# Patient Record
Sex: Female | Born: 1950 | Race: Black or African American | Hispanic: No | Marital: Married | Smoking: Former smoker
Health system: Southern US, Community
[De-identification: ages and names within clinical notes are randomized; demographics above are authoritative.]

## PROBLEM LIST (undated history)

## (undated) DIAGNOSIS — F32A Depression, unspecified: Secondary | ICD-10-CM

## (undated) DIAGNOSIS — W3400XA Accidental discharge from unspecified firearms or gun, initial encounter: Secondary | ICD-10-CM

## (undated) DIAGNOSIS — H409 Unspecified glaucoma: Secondary | ICD-10-CM

## (undated) DIAGNOSIS — N189 Chronic kidney disease, unspecified: Secondary | ICD-10-CM

## (undated) DIAGNOSIS — C50919 Malignant neoplasm of unspecified site of unspecified female breast: Secondary | ICD-10-CM

## (undated) DIAGNOSIS — G4730 Sleep apnea, unspecified: Secondary | ICD-10-CM

## (undated) DIAGNOSIS — F329 Major depressive disorder, single episode, unspecified: Secondary | ICD-10-CM

## (undated) DIAGNOSIS — K259 Gastric ulcer, unspecified as acute or chronic, without hemorrhage or perforation: Secondary | ICD-10-CM

## (undated) DIAGNOSIS — I10 Essential (primary) hypertension: Secondary | ICD-10-CM

## (undated) DIAGNOSIS — K219 Gastro-esophageal reflux disease without esophagitis: Secondary | ICD-10-CM

## (undated) DIAGNOSIS — J449 Chronic obstructive pulmonary disease, unspecified: Secondary | ICD-10-CM

## (undated) DIAGNOSIS — F419 Anxiety disorder, unspecified: Secondary | ICD-10-CM

## (undated) DIAGNOSIS — J45909 Unspecified asthma, uncomplicated: Secondary | ICD-10-CM

## (undated) HISTORY — PX: MASTECTOMY: SHX3

## (undated) HISTORY — PX: CARPAL TUNNEL RELEASE: SHX101

---

## 2007-03-12 ENCOUNTER — Emergency Department (HOSPITAL_COMMUNITY): Admission: EM | Admit: 2007-03-12 | Discharge: 2007-03-12 | Payer: Self-pay | Admitting: Emergency Medicine

## 2015-01-18 DIAGNOSIS — Z760 Encounter for issue of repeat prescription: Secondary | ICD-10-CM | POA: Diagnosis not present

## 2015-01-18 DIAGNOSIS — J440 Chronic obstructive pulmonary disease with acute lower respiratory infection: Secondary | ICD-10-CM | POA: Diagnosis not present

## 2015-01-18 DIAGNOSIS — Z87891 Personal history of nicotine dependence: Secondary | ICD-10-CM | POA: Diagnosis not present

## 2015-01-18 DIAGNOSIS — M5489 Other dorsalgia: Secondary | ICD-10-CM | POA: Diagnosis not present

## 2015-02-27 DIAGNOSIS — Z9841 Cataract extraction status, right eye: Secondary | ICD-10-CM | POA: Diagnosis not present

## 2015-02-27 DIAGNOSIS — Z9842 Cataract extraction status, left eye: Secondary | ICD-10-CM | POA: Diagnosis not present

## 2015-02-27 DIAGNOSIS — H4011X3 Primary open-angle glaucoma, severe stage: Secondary | ICD-10-CM | POA: Diagnosis not present

## 2015-02-27 DIAGNOSIS — H04123 Dry eye syndrome of bilateral lacrimal glands: Secondary | ICD-10-CM | POA: Diagnosis not present

## 2015-03-17 DIAGNOSIS — R079 Chest pain, unspecified: Secondary | ICD-10-CM | POA: Diagnosis not present

## 2015-03-17 DIAGNOSIS — I517 Cardiomegaly: Secondary | ICD-10-CM | POA: Diagnosis not present

## 2015-03-20 DIAGNOSIS — J449 Chronic obstructive pulmonary disease, unspecified: Secondary | ICD-10-CM | POA: Diagnosis not present

## 2015-03-20 DIAGNOSIS — M5489 Other dorsalgia: Secondary | ICD-10-CM | POA: Diagnosis not present

## 2015-03-20 DIAGNOSIS — M797 Fibromyalgia: Secondary | ICD-10-CM | POA: Diagnosis not present

## 2015-03-21 DIAGNOSIS — B1920 Unspecified viral hepatitis C without hepatic coma: Secondary | ICD-10-CM | POA: Diagnosis not present

## 2015-03-21 DIAGNOSIS — K759 Inflammatory liver disease, unspecified: Secondary | ICD-10-CM | POA: Diagnosis not present

## 2015-03-21 DIAGNOSIS — Z8619 Personal history of other infectious and parasitic diseases: Secondary | ICD-10-CM | POA: Diagnosis not present

## 2015-03-21 DIAGNOSIS — B182 Chronic viral hepatitis C: Secondary | ICD-10-CM | POA: Diagnosis not present

## 2015-03-21 DIAGNOSIS — J984 Other disorders of lung: Secondary | ICD-10-CM | POA: Diagnosis not present

## 2015-04-20 ENCOUNTER — Encounter (HOSPITAL_COMMUNITY): Payer: Self-pay | Admitting: *Deleted

## 2015-04-20 ENCOUNTER — Emergency Department (HOSPITAL_COMMUNITY)
Admission: EM | Admit: 2015-04-20 | Discharge: 2015-04-20 | Disposition: A | Discharge status: Home / Self Care | Payer: Medicare Other | Attending: Emergency Medicine | Admitting: Emergency Medicine

## 2015-04-20 ENCOUNTER — Emergency Department (HOSPITAL_COMMUNITY): Payer: Medicare Other

## 2015-04-20 DIAGNOSIS — I10 Essential (primary) hypertension: Secondary | ICD-10-CM | POA: Insufficient documentation

## 2015-04-20 DIAGNOSIS — R05 Cough: Secondary | ICD-10-CM

## 2015-04-20 DIAGNOSIS — J069 Acute upper respiratory infection, unspecified: Secondary | ICD-10-CM | POA: Diagnosis not present

## 2015-04-20 DIAGNOSIS — Z853 Personal history of malignant neoplasm of breast: Secondary | ICD-10-CM | POA: Diagnosis not present

## 2015-04-20 DIAGNOSIS — Z87828 Personal history of other (healed) physical injury and trauma: Secondary | ICD-10-CM | POA: Diagnosis not present

## 2015-04-20 DIAGNOSIS — R059 Cough, unspecified: Secondary | ICD-10-CM

## 2015-04-20 HISTORY — DX: Essential (primary) hypertension: I10

## 2015-04-20 HISTORY — DX: Malignant neoplasm of unspecified site of unspecified female breast: C50.919

## 2015-04-20 HISTORY — DX: Accidental discharge from unspecified firearms or gun, initial encounter: W34.00XA

## 2015-04-20 MED ORDER — GUAIFENESIN-CODEINE 100-10 MG/5ML PO SOLN: 5.0000 mL | Freq: Four times a day (QID) | ORAL | Status: DC | PRN

## 2015-04-20 MED ORDER — PREDNISONE 20 MG PO TABS: 40.0000 mg | ORAL_TABLET | Freq: Every day | ORAL | Status: DC

## 2015-04-20 MED ORDER — GUAIFENESIN-CODEINE 100-10 MG/5ML PO SOLN
5.0000 mL | Freq: Once | ORAL | Status: AC
  Administered 2015-04-20: 5 mL via ORAL
  Filled 2015-04-20: qty 5

## 2015-04-20 NOTE — ED Notes (Signed)
Pt c/o generalized body aches, cough, sore throat for 4-5 days.

## 2015-04-20 NOTE — Discharge Instructions (Signed)
Upper Respiratory Infection, Adult An upper respiratory infection (URI) is also sometimes known as the common cold. The upper respiratory tract includes the nose, sinuses, throat, trachea, and bronchi. Bronchi are the airways leading to the lungs. Most people improve within 1 week, but symptoms can last up to 2 weeks. A residual cough may last even longer.  CAUSES Many different viruses can infect the tissues lining the upper respiratory tract. The tissues become irritated and inflamed and often become very moist. Mucus production is also common. A cold is contagious. You can easily spread the virus to others by oral contact. This includes kissing, sharing a glass, coughing, or sneezing. Touching your mouth or nose and then touching a surface, which is then touched by another person, can also spread the virus. SYMPTOMS  Symptoms typically develop 1 to 3 days after you come in contact with a cold virus. Symptoms vary from person to person. They may include:  Runny nose.  Sneezing.  Nasal congestion.  Sinus irritation.  Sore throat.  Loss of voice (laryngitis).  Cough.  Fatigue.  Muscle aches.  Loss of appetite.  Headache.  Low-grade fever. DIAGNOSIS  You might diagnose your own cold based on familiar symptoms, since most people get a cold 2 to 3 times a year. Your caregiver can confirm this based on your exam. Most importantly, your caregiver can check that your symptoms are not due to another disease such as strep throat, sinusitis, pneumonia, asthma, or epiglottitis. Blood tests, throat tests, and X-rays are not necessary to diagnose a common cold, but they may sometimes be helpful in excluding other more serious diseases. Your caregiver will decide if any further tests are required. RISKS AND COMPLICATIONS  You may be at risk for a more severe case of the common cold if you smoke cigarettes, have chronic heart disease (such as heart failure) or lung disease (such as asthma), or if  you have a weakened immune system. The very young and very old are also at risk for more serious infections. Bacterial sinusitis, middle ear infections, and bacterial pneumonia can complicate the common cold. The common cold can worsen asthma and chronic obstructive pulmonary disease (COPD). Sometimes, these complications can require emergency medical care and may be life-threatening. PREVENTION  The best way to protect against getting a cold is to practice good hygiene. Avoid oral or hand contact with people with cold symptoms. Wash your hands often if contact occurs. There is no clear evidence that vitamin C, vitamin E, echinacea, or exercise reduces the chance of developing a cold. However, it is always recommended to get plenty of rest and practice good nutrition. TREATMENT  Treatment is directed at relieving symptoms. There is no cure. Antibiotics are not effective, because the infection is caused by a virus, not by bacteria. Treatment may include:  Increased fluid intake. Sports drinks offer valuable electrolytes, sugars, and fluids.  Breathing heated mist or steam (vaporizer or shower).  Eating chicken soup or other clear broths, and maintaining good nutrition.  Getting plenty of rest.  Using gargles or lozenges for comfort.  Controlling fevers with ibuprofen or acetaminophen as directed by your caregiver.  Increasing usage of your inhaler if you have asthma. Zinc gel and zinc lozenges, taken in the first 24 hours of the common cold, can shorten the duration and lessen the severity of symptoms. Pain medicines may help with fever, muscle aches, and throat pain. A variety of non-prescription medicines are available to treat congestion and runny nose. Your caregiver   can make recommendations and may suggest nasal or lung inhalers for other symptoms.  HOME CARE INSTRUCTIONS   Only take over-the-counter or prescription medicines for pain, discomfort, or fever as directed by your  caregiver.  Use a warm mist humidifier or inhale steam from a shower to increase air moisture. This may keep secretions moist and make it easier to breathe.  Drink enough water and fluids to keep your urine clear or pale yellow.  Rest as needed.  Return to work when your temperature has returned to normal or as your caregiver advises. You may need to stay home longer to avoid infecting others. You can also use a face mask and careful hand washing to prevent spread of the virus. SEEK MEDICAL CARE IF:   After the first few days, you feel you are getting worse rather than better.  You need your caregiver's advice about medicines to control symptoms.  You develop chills, worsening shortness of breath, or brown or red sputum. These may be signs of pneumonia.  You develop yellow or brown nasal discharge or pain in the face, especially when you bend forward. These may be signs of sinusitis.  You develop a fever, swollen neck glands, pain with swallowing, or white areas in the back of your throat. These may be signs of strep throat. SEEK IMMEDIATE MEDICAL CARE IF:   You have a fever.  You develop severe or persistent headache, ear pain, sinus pain, or chest pain.  You develop wheezing, a prolonged cough, cough up blood, or have a change in your usual mucus (if you have chronic lung disease).  You develop sore muscles or a stiff neck. Document Released: 06/11/2001 Document Revised: 03/09/2012 Document Reviewed: 03/23/2014 Exit[employer] Patient Information 2015 [employer]. This information is not intended to replace advice given to you by your health care provider. Make sure you discuss any questions you have with your health care provider.  
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
## 2015-04-27 DIAGNOSIS — M4696 Unspecified inflammatory spondylopathy, lumbar region: Secondary | ICD-10-CM | POA: Diagnosis not present

## 2015-04-27 DIAGNOSIS — J4521 Mild intermittent asthma with (acute) exacerbation: Secondary | ICD-10-CM | POA: Diagnosis not present

## 2015-04-27 DIAGNOSIS — M5489 Other dorsalgia: Secondary | ICD-10-CM | POA: Diagnosis not present

## 2015-04-27 DIAGNOSIS — Z87891 Personal history of nicotine dependence: Secondary | ICD-10-CM | POA: Diagnosis not present

## 2015-05-25 DIAGNOSIS — J449 Chronic obstructive pulmonary disease, unspecified: Secondary | ICD-10-CM | POA: Diagnosis not present

## 2015-05-25 DIAGNOSIS — M545 Low back pain: Secondary | ICD-10-CM | POA: Diagnosis not present

## 2015-05-25 DIAGNOSIS — Z760 Encounter for issue of repeat prescription: Secondary | ICD-10-CM | POA: Diagnosis not present

## 2015-06-28 DIAGNOSIS — Z760 Encounter for issue of repeat prescription: Secondary | ICD-10-CM | POA: Diagnosis not present

## 2015-06-28 DIAGNOSIS — M5489 Other dorsalgia: Secondary | ICD-10-CM | POA: Diagnosis not present

## 2015-08-07 DIAGNOSIS — M545 Low back pain: Secondary | ICD-10-CM | POA: Diagnosis not present

## 2015-08-07 DIAGNOSIS — R531 Weakness: Secondary | ICD-10-CM | POA: Diagnosis not present

## 2015-08-07 DIAGNOSIS — Z760 Encounter for issue of repeat prescription: Secondary | ICD-10-CM | POA: Diagnosis not present

## 2015-08-07 DIAGNOSIS — M5489 Other dorsalgia: Secondary | ICD-10-CM | POA: Diagnosis not present

## 2015-09-07 DIAGNOSIS — Z9011 Acquired absence of right breast and nipple: Secondary | ICD-10-CM | POA: Diagnosis not present

## 2015-09-07 DIAGNOSIS — J3081 Allergic rhinitis due to animal (cat) (dog) hair and dander: Secondary | ICD-10-CM | POA: Diagnosis not present

## 2015-09-07 DIAGNOSIS — J410 Simple chronic bronchitis: Secondary | ICD-10-CM | POA: Diagnosis not present

## 2015-09-07 DIAGNOSIS — M545 Low back pain: Secondary | ICD-10-CM | POA: Diagnosis not present

## 2015-10-10 DIAGNOSIS — G8929 Other chronic pain: Secondary | ICD-10-CM | POA: Insufficient documentation

## 2015-10-10 DIAGNOSIS — G4733 Obstructive sleep apnea (adult) (pediatric): Secondary | ICD-10-CM | POA: Insufficient documentation

## 2016-05-10 DIAGNOSIS — B078 Other viral warts: Secondary | ICD-10-CM | POA: Diagnosis not present

## 2016-05-22 DIAGNOSIS — R1110 Vomiting, unspecified: Secondary | ICD-10-CM | POA: Diagnosis not present

## 2016-05-22 DIAGNOSIS — G4730 Sleep apnea, unspecified: Secondary | ICD-10-CM | POA: Diagnosis not present

## 2016-05-22 DIAGNOSIS — R109 Unspecified abdominal pain: Secondary | ICD-10-CM | POA: Diagnosis not present

## 2016-05-22 DIAGNOSIS — I10 Essential (primary) hypertension: Secondary | ICD-10-CM | POA: Diagnosis not present

## 2016-06-04 DIAGNOSIS — K210 Gastro-esophageal reflux disease with esophagitis: Secondary | ICD-10-CM | POA: Diagnosis not present

## 2016-06-04 DIAGNOSIS — K253 Acute gastric ulcer without hemorrhage or perforation: Secondary | ICD-10-CM | POA: Diagnosis not present

## 2016-06-04 DIAGNOSIS — K2980 Duodenitis without bleeding: Secondary | ICD-10-CM | POA: Diagnosis not present

## 2016-06-04 DIAGNOSIS — K2900 Acute gastritis without bleeding: Secondary | ICD-10-CM | POA: Diagnosis not present

## 2016-06-05 DIAGNOSIS — B078 Other viral warts: Secondary | ICD-10-CM | POA: Diagnosis not present

## 2016-09-13 ENCOUNTER — Encounter (HOSPITAL_COMMUNITY): Payer: Self-pay

## 2016-09-13 ENCOUNTER — Emergency Department (HOSPITAL_COMMUNITY): Payer: Medicare Other

## 2016-09-13 ENCOUNTER — Observation Stay (HOSPITAL_COMMUNITY)
Admission: EM | Admit: 2016-09-13 | Discharge: 2016-09-14 | Disposition: A | Discharge status: Home / Self Care | Payer: Medicare Other | Attending: Nephrology | Admitting: Nephrology

## 2016-09-13 DIAGNOSIS — J441 Chronic obstructive pulmonary disease with (acute) exacerbation: Secondary | ICD-10-CM | POA: Insufficient documentation

## 2016-09-13 DIAGNOSIS — R110 Nausea: Secondary | ICD-10-CM

## 2016-09-13 DIAGNOSIS — Z66 Do not resuscitate: Secondary | ICD-10-CM | POA: Diagnosis not present

## 2016-09-13 DIAGNOSIS — J9601 Acute respiratory failure with hypoxia: Secondary | ICD-10-CM

## 2016-09-13 DIAGNOSIS — G894 Chronic pain syndrome: Secondary | ICD-10-CM

## 2016-09-13 DIAGNOSIS — J40 Bronchitis, not specified as acute or chronic: Secondary | ICD-10-CM

## 2016-09-13 DIAGNOSIS — I10 Essential (primary) hypertension: Secondary | ICD-10-CM | POA: Diagnosis not present

## 2016-09-13 DIAGNOSIS — E785 Hyperlipidemia, unspecified: Secondary | ICD-10-CM | POA: Diagnosis present

## 2016-09-13 DIAGNOSIS — J431 Panlobular emphysema: Secondary | ICD-10-CM

## 2016-09-13 DIAGNOSIS — Z79899 Other long term (current) drug therapy: Secondary | ICD-10-CM | POA: Insufficient documentation

## 2016-09-13 DIAGNOSIS — Z9011 Acquired absence of right breast and nipple: Secondary | ICD-10-CM | POA: Insufficient documentation

## 2016-09-13 DIAGNOSIS — J9600 Acute respiratory failure, unspecified whether with hypoxia or hypercapnia: Principal | ICD-10-CM | POA: Diagnosis present

## 2016-09-13 DIAGNOSIS — J45901 Unspecified asthma with (acute) exacerbation: Secondary | ICD-10-CM | POA: Diagnosis present

## 2016-09-13 DIAGNOSIS — J069 Acute upper respiratory infection, unspecified: Secondary | ICD-10-CM | POA: Diagnosis not present

## 2016-09-13 DIAGNOSIS — Z853 Personal history of malignant neoplasm of breast: Secondary | ICD-10-CM | POA: Diagnosis not present

## 2016-09-13 DIAGNOSIS — R0602 Shortness of breath: Secondary | ICD-10-CM

## 2016-09-13 DIAGNOSIS — B9789 Other viral agents as the cause of diseases classified elsewhere: Secondary | ICD-10-CM

## 2016-09-13 DIAGNOSIS — J449 Chronic obstructive pulmonary disease, unspecified: Secondary | ICD-10-CM | POA: Diagnosis present

## 2016-09-13 DIAGNOSIS — R062 Wheezing: Secondary | ICD-10-CM

## 2016-09-13 HISTORY — DX: Unspecified asthma, uncomplicated: J45.909

## 2016-09-13 HISTORY — DX: Chronic obstructive pulmonary disease, unspecified: J44.9

## 2016-09-13 LAB — BASIC METABOLIC PANEL
Anion gap: 10 (ref 5–15)
BUN: 11 mg/dL (ref 6–20)
CALCIUM: 9.8 mg/dL (ref 8.9–10.3)
CHLORIDE: 110 mmol/L (ref 101–111)
CO2: 23 mmol/L (ref 22–32)
CREATININE: 0.76 mg/dL (ref 0.44–1.00)
GFR calc Af Amer: >60 mL/min (ref >60)
GFR calc non Af Amer: >60 mL/min (ref >60)
GLUCOSE: 81 mg/dL (ref 65–99)
Potassium: 3.5 mmol/L (ref 3.5–5.1)
Sodium: 143 mmol/L (ref 135–145)

## 2016-09-13 LAB — CBC WITH DIFFERENTIAL/PLATELET
BASOS PCT: 1 %
Basophils Absolute: 0 10*3/uL (ref 0.0–0.1)
Eosinophils Absolute: 0.2 10*3/uL (ref 0.0–0.7)
Eosinophils Relative: 3 %
HEMATOCRIT: 42.9 % (ref 36.0–46.0)
HEMOGLOBIN: 13.6 g/dL (ref 12.0–15.0)
LYMPHS ABS: 1.7 10*3/uL (ref 0.7–4.0)
LYMPHS PCT: 22 %
MCH: 26.6 pg (ref 26.0–34.0)
MCHC: 31.7 g/dL (ref 30.0–36.0)
MCV: 84 fL (ref 78.0–100.0)
MONO ABS: 0.8 10*3/uL (ref 0.1–1.0)
MONOS PCT: 10 %
NEUTROS ABS: 4.8 10*3/uL (ref 1.7–7.7)
Neutrophils Relative %: 64 %
Platelets: 247 10*3/uL (ref 150–400)
RBC: 5.11 MIL/uL (ref 3.87–5.11)
RDW: 15.1 % (ref 11.5–15.5)
WBC: 7.5 10*3/uL (ref 4.0–10.5)

## 2016-09-13 LAB — I-STAT TROPONIN, ED: Troponin i, poc: 0 ng/mL (ref 0.00–0.08)

## 2016-09-13 MED ORDER — ATORVASTATIN CALCIUM 20 MG PO TABS: 20.0000 mg | ORAL_TABLET | Freq: Every day | ORAL | Status: DC

## 2016-09-13 MED ORDER — PREDNISONE 50 MG PO TABS: 50.0000 mg | ORAL_TABLET | Freq: Every day | ORAL | Status: DC

## 2016-09-13 MED ORDER — FAMOTIDINE 20 MG PO TABS
20.0000 mg | ORAL_TABLET | Freq: Every day | ORAL | Status: DC
  Administered 2016-09-13 – 2016-09-14 (×2): 20 mg via ORAL
  Filled 2016-09-13 (×2): qty 1

## 2016-09-13 MED ORDER — IPRATROPIUM BROMIDE 0.02 % IN SOLN
0.5000 mg | Freq: Once | RESPIRATORY_TRACT | Status: AC
  Administered 2016-09-13: 0.5 mg via RESPIRATORY_TRACT
  Filled 2016-09-13: qty 2.5

## 2016-09-13 MED ORDER — ALBUTEROL SULFATE (2.5 MG/3ML) 0.083% IN NEBU
5.0000 mg | INHALATION_SOLUTION | Freq: Once | RESPIRATORY_TRACT | Status: AC
  Administered 2016-09-13: 5 mg via RESPIRATORY_TRACT
  Filled 2016-09-13: qty 6

## 2016-09-13 MED ORDER — ONDANSETRON HCL 4 MG/2ML IJ SOLN
4.0000 mg | Freq: Once | INTRAMUSCULAR | Status: AC
  Administered 2016-09-13: 4 mg via INTRAVENOUS
  Filled 2016-09-13: qty 2

## 2016-09-13 MED ORDER — BRINZOLAMIDE 1 % OP SUSP
1.0000 [drp] | Freq: Three times a day (TID) | OPHTHALMIC | Status: DC
  Administered 2016-09-13 – 2016-09-14 (×3): 1 [drp] via OPHTHALMIC
  Filled 2016-09-13: qty 10

## 2016-09-13 MED ORDER — HYDROCODONE-HOMATROPINE 5-1.5 MG/5ML PO SYRP
5.0000 mL | ORAL_SOLUTION | ORAL | Status: DC | PRN
  Administered 2016-09-13: 5 mL via ORAL
  Filled 2016-09-13: qty 5

## 2016-09-13 MED ORDER — ONDANSETRON HCL 4 MG/2ML IJ SOLN
4.0000 mg | Freq: Four times a day (QID) | INTRAMUSCULAR | Status: DC | PRN
  Administered 2016-09-14: 4 mg via INTRAVENOUS

## 2016-09-13 MED ORDER — SODIUM CHLORIDE 0.9 % IV SOLN
INTRAVENOUS | Status: DC
  Administered 2016-09-13 – 2016-09-14 (×2): via INTRAVENOUS

## 2016-09-13 MED ORDER — ACETAMINOPHEN 325 MG PO TABS: 650.0000 mg | ORAL_TABLET | Freq: Four times a day (QID) | ORAL | Status: DC | PRN

## 2016-09-13 MED ORDER — PREDNISONE 50 MG PO TABS
50.0000 mg | ORAL_TABLET | Freq: Every day | ORAL | Status: DC
  Administered 2016-09-14: 50 mg via ORAL
  Filled 2016-09-13: qty 1

## 2016-09-13 MED ORDER — OXYCODONE-ACETAMINOPHEN 5-325 MG PO TABS
2.0000 | ORAL_TABLET | ORAL | Status: DC | PRN
  Administered 2016-09-13 – 2016-09-14 (×4): 2 via ORAL
  Filled 2016-09-13 (×4): qty 2

## 2016-09-13 MED ORDER — MAGNESIUM SULFATE 2 GM/50ML IV SOLN
2.0000 g | Freq: Once | INTRAVENOUS | Status: AC
  Administered 2016-09-13: 2 g via INTRAVENOUS
  Filled 2016-09-13: qty 50

## 2016-09-13 MED ORDER — ENOXAPARIN SODIUM 40 MG/0.4ML ~~LOC~~ SOLN
40.0000 mg | SUBCUTANEOUS | Status: DC
  Administered 2016-09-13: 40 mg via SUBCUTANEOUS
  Filled 2016-09-13: qty 0.4

## 2016-09-13 MED ORDER — ATORVASTATIN CALCIUM 20 MG PO TABS
20.0000 mg | ORAL_TABLET | Freq: Every day | ORAL | Status: DC
  Administered 2016-09-13: 20 mg via ORAL
  Filled 2016-09-13: qty 1

## 2016-09-13 MED ORDER — OXYCODONE-ACETAMINOPHEN 10-325 MG PO TABS: 2.0000 | ORAL_TABLET | ORAL | Status: DC | PRN

## 2016-09-13 MED ORDER — BENZONATATE 100 MG PO CAPS
200.0000 mg | ORAL_CAPSULE | Freq: Three times a day (TID) | ORAL | Status: DC
  Administered 2016-09-13 – 2016-09-14 (×3): 200 mg via ORAL
  Filled 2016-09-13 (×3): qty 2

## 2016-09-13 MED ORDER — IPRATROPIUM-ALBUTEROL 0.5-2.5 (3) MG/3ML IN SOLN
3.0000 mL | RESPIRATORY_TRACT | Status: DC | PRN
  Administered 2016-09-14: 3 mL via RESPIRATORY_TRACT
  Filled 2016-09-13: qty 3

## 2016-09-13 MED ORDER — AMLODIPINE BESYLATE 10 MG PO TABS
10.0000 mg | ORAL_TABLET | Freq: Every day | ORAL | Status: DC
  Administered 2016-09-13 – 2016-09-14 (×2): 10 mg via ORAL
  Filled 2016-09-13 (×2): qty 1

## 2016-09-13 MED ORDER — TIOTROPIUM BROMIDE MONOHYDRATE 18 MCG IN CAPS
18.0000 ug | ORAL_CAPSULE | Freq: Every day | RESPIRATORY_TRACT | Status: DC
  Filled 2016-09-13: qty 5

## 2016-09-13 MED ORDER — ACETAMINOPHEN 650 MG RE SUPP: 650.0000 mg | Freq: Four times a day (QID) | RECTAL | Status: DC | PRN

## 2016-09-13 MED ORDER — ALBUTEROL (5 MG/ML) CONTINUOUS INHALATION SOLN
10.0000 mg/h | INHALATION_SOLUTION | RESPIRATORY_TRACT | Status: DC
  Administered 2016-09-13: 10 mg/h via RESPIRATORY_TRACT
  Filled 2016-09-13: qty 20

## 2016-09-13 MED ORDER — ONDANSETRON HCL 4 MG PO TABS
4.0000 mg | ORAL_TABLET | Freq: Four times a day (QID) | ORAL | Status: DC | PRN
Start: 2016-09-13 — End: 2016-09-14

## 2016-09-13 MED ORDER — ENSURE ENLIVE PO LIQD
237.0000 mL | Freq: Two times a day (BID) | ORAL | Status: DC
  Administered 2016-09-14 (×2): 237 mL via ORAL

## 2016-09-13 MED ORDER — PREDNISONE 20 MG PO TABS
60.0000 mg | ORAL_TABLET | Freq: Once | ORAL | Status: AC
  Administered 2016-09-13: 60 mg via ORAL
  Filled 2016-09-13: qty 3

## 2016-09-13 MED ORDER — PREGABALIN 25 MG PO CAPS
25.0000 mg | ORAL_CAPSULE | Freq: Three times a day (TID) | ORAL | Status: DC
  Administered 2016-09-13 – 2016-09-14 (×2): 25 mg via ORAL
  Filled 2016-09-13 (×2): qty 1

## 2016-09-13 MED ORDER — BRIMONIDINE TARTRATE 0.15 % OP SOLN
1.0000 [drp] | Freq: Three times a day (TID) | OPHTHALMIC | Status: DC
  Administered 2016-09-13 – 2016-09-14 (×4): 1 [drp] via OPHTHALMIC
  Filled 2016-09-13: qty 5

## 2016-09-13 MED ORDER — LATANOPROST 0.005 % OP SOLN
1.0000 [drp] | Freq: Every day | OPHTHALMIC | Status: DC
  Administered 2016-09-13: 1 [drp] via OPHTHALMIC
  Filled 2016-09-13: qty 2.5

## 2016-09-13 MED ORDER — KETOROLAC TROMETHAMINE 30 MG/ML IJ SOLN
15.0000 mg | Freq: Once | INTRAMUSCULAR | Status: AC
  Administered 2016-09-13: 10:00:00 via INTRAVENOUS
  Filled 2016-09-13: qty 1

## 2016-09-13 MED ORDER — OXYCODONE HCL 5 MG PO TABS
10.0000 mg | ORAL_TABLET | ORAL | Status: DC | PRN
  Administered 2016-09-13 – 2016-09-14 (×4): 10 mg via ORAL
  Filled 2016-09-13 (×4): qty 2

## 2016-09-13 NOTE — ED Notes (Signed)
Pt ambulated in hallway with zero assistance. Pts O2 was 97% the whole time. Pt had no complaints at this time. Pt placed back on monitor.
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
## 2016-09-13 NOTE — ED Triage Notes (Signed)
Per Pt, Pt is coming from home with complaints of a productive cough that started a couple days ago. Pt reports yellow sputum and white drainage from her nose. Pt reports having wheezing and Hx of Asthma.

## 2016-09-14 DIAGNOSIS — J45901 Unspecified asthma with (acute) exacerbation: Secondary | ICD-10-CM

## 2016-09-14 DIAGNOSIS — J9600 Acute respiratory failure, unspecified whether with hypoxia or hypercapnia: Secondary | ICD-10-CM | POA: Diagnosis not present

## 2016-09-14 LAB — BASIC METABOLIC PANEL
ANION GAP: 6 (ref 5–15)
BUN: 16 mg/dL (ref 6–20)
CALCIUM: 8.9 mg/dL (ref 8.9–10.3)
CO2: 24 mmol/L (ref 22–32)
Chloride: 108 mmol/L (ref 101–111)
Creatinine, Ser: 0.95 mg/dL (ref 0.44–1.00)
GLUCOSE: 133 mg/dL — AB (ref 65–99)
POTASSIUM: 3.9 mmol/L (ref 3.5–5.1)
SODIUM: 138 mmol/L (ref 135–145)

## 2016-09-14 LAB — CBC
HCT: 35.9 % — ABNORMAL LOW (ref 36.0–46.0)
Hemoglobin: 11.1 g/dL — ABNORMAL LOW (ref 12.0–15.0)
MCH: 26 pg (ref 26.0–34.0)
MCHC: 30.9 g/dL (ref 30.0–36.0)
MCV: 84.1 fL (ref 78.0–100.0)
PLATELETS: 220 10*3/uL (ref 150–400)
RBC: 4.27 MIL/uL (ref 3.87–5.11)
RDW: 15.2 % (ref 11.5–15.5)
WBC: 8.3 10*3/uL (ref 4.0–10.5)

## 2016-09-14 LAB — RESPIRATORY PANEL BY PCR
ADENOVIRUS-RVPPCR: NOT DETECTED
Bordetella pertussis: NOT DETECTED
CHLAMYDOPHILA PNEUMONIAE-RVPPCR: NOT DETECTED
CORONAVIRUS HKU1-RVPPCR: NOT DETECTED
CORONAVIRUS NL63-RVPPCR: NOT DETECTED
CORONAVIRUS OC43-RVPPCR: NOT DETECTED
Coronavirus 229E: NOT DETECTED
Influenza A: NOT DETECTED
Influenza B: NOT DETECTED
Metapneumovirus: NOT DETECTED
Mycoplasma pneumoniae: NOT DETECTED
PARAINFLUENZA VIRUS 1-RVPPCR: NOT DETECTED
PARAINFLUENZA VIRUS 3-RVPPCR: NOT DETECTED
Parainfluenza Virus 2: NOT DETECTED
Parainfluenza Virus 4: NOT DETECTED
RHINOVIRUS / ENTEROVIRUS - RVPPCR: DETECTED — AB
Respiratory Syncytial Virus: NOT DETECTED

## 2016-09-14 MED ORDER — PREDNISONE 50 MG PO TABS: 50.0000 mg | ORAL_TABLET | Freq: Every day | ORAL | 0 refills | Status: DC

## 2016-09-14 MED ORDER — BENZONATATE 200 MG PO CAPS: 200.0000 mg | ORAL_CAPSULE | Freq: Three times a day (TID) | ORAL | 0 refills | Status: DC | PRN

## 2016-09-14 NOTE — Progress Notes (Signed)
Patient discharge instructions were reviewed with patient. Patient verbalized understanding.

## 2016-09-14 NOTE — Progress Notes (Signed)
Nutrition Brief Note  Patient identified on the Malnutrition Screening Tool (MST) Report  Wt Readings from Last 15 Encounters:  09/14/16 164 lb 8 oz (74.6 kg)  04/20/15 193 lb 3 oz (87.6 kg)   Patient has discharge orders. Note for documentation of time.  Pt had reported lost weight. She said she still eats fairly well. She says the reason she thinks has lost weight is due to her gastric ulcers. She sees a Tax adviser monthly for this. She gives UBW of 180-188 lbs.   She notes she is on repletion dose of Vitamin D.  She also reports significant depression/emotional distress. Her son died 3 years ago. She would visit the [place]emetary each day, all day for an unknown length of time. Apparently she moved down to [place] to escape living across from the [place] where he was buried. She says only goes back 1x a month now. She apparently has been told to seek counseling. This sounds like it could be the major cause of her wt loss.   She has d/c orders in and is leaving today. No nutrition interventions warranted at this time. If nutrition issues arise, please consult RD.   Burtis Junes RD, LDN, CNSC Clinical Nutrition Pager: [phone] 09/14/2016 1:21 PM

## 2016-09-14 NOTE — Discharge Summary (Signed)
Physician Discharge Summary  Leslie Allen [id number] DOB: 1951-10-15 DOA: 09/13/2016  PCP: No PCP Per Patient  Admit date: 09/13/2016 Discharge date: 09/14/2016  Admitted From:home Disposition: home  Recommendations for Outpatient Follow-up:  1. Follow up with PCP in 1-2 weeks  Home Health:no Equipment/Devices:no  Discharge Condition:stable CODE STATUS:full Diet recommendation: heart healthy   Brief/Interim Summary:  65 year old female with history of breast cancer status post right mastectomy, COPD/asthma, dyslipidemia, hypertension presented with cough and mild shortness of breath. Patient reported that she was traveling in a bus from [place] when she contacted with the person with upper respiratory tract infection. Patient was admitted for acute respiratory failure secondary due to asthma exacerbation, COPD with possible viral infection. Patient reported dry cough. Treated with steroids with significant improvement in symptoms. Today patient reported feeling much better and she was able to ambulate without difficulty. Her oxygen saturation was acceptable in room air. I advised patient to continue her bronchodilators and medication at home. I also recommended her to follow-up with her PCP in next 1-2 weeks. Patient verbalized understanding. Patient is being discharged home in stable condition.  Discharge Diagnoses:  Principal Problem:   Asthma exacerbation Active Problems:   Viral URI with cough   COPD (chronic obstructive pulmonary disease) ([facility])     HLD (hyperlipidemia)      Discharge Instructions  Discharge Instructions    Call MD for:  difficulty breathing, headache or visual disturbances    Complete by:  As directed    Call MD for:  temperature >100.4    Complete by:  As directed    Diet - low sodium heart healthy    Complete by:  As directed    Discharge instructions    Complete by:  As directed    Please follow up with PCP in 1-2 weeks.  You may need pulmonary  referral from your PCP.   Increase activity slowly    Complete by:  As directed        Medication List    STOP taking these medications   oxyCODONE-acetaminophen 10-325 MG tablet Commonly known as:  PERCOCET   promethazine-codeine 6.25-10 MG/5ML syrup Commonly known as:  PHENERGAN with CODEINE     TAKE these medications   albuterol (2.5 MG/3ML) 0.083% nebulizer solution Commonly known as:  PROVENTIL Take 2.5 mg by nebulization every 6 (six) hours as needed for wheezing or shortness of breath.   albuterol 108 (90 Base) MCG/ACT inhaler Commonly known as:  PROVENTIL HFA;VENTOLIN HFA Inhale 1-2 puffs into the lungs every 6 (six) hours as needed for wheezing or shortness of breath.   ALPHAGAN P 0.1 % Soln Generic drug:  brimonidine Place 1 drop into both eyes every 8 (eight) hours.   atorvastatin 20 MG tablet Commonly known as:  LIPITOR Take 20 mg by mouth daily.   benzonatate 200 MG capsule Commonly known as:  TESSALON Take 1 capsule (200 mg total) by mouth 3 (three) times daily as needed for cough.   bimatoprost 0.01 % Soln Commonly known as:  LUMIGAN Place 1 drop into both eyes at bedtime.   brinzolamide 1 % ophthalmic suspension Commonly known as:  AZOPT Place 1 drop into both eyes 3 (three) times daily.   predniSONE 50 MG tablet Commonly known as:  DELTASONE Take 1 tablet (50 mg total) by mouth daily with breakfast.   pregabalin 25 MG capsule Commonly known as:  LYRICA Take 25 mg by mouth 3 (three) times daily.   ranitidine 150 MG tablet  Commonly known as:  ZANTAC Take 150 mg by mouth daily.   tiotropium 18 MCG inhalation capsule Commonly known as:  SPIRIVA Place 18 mcg into inhaler and inhale daily.   Vitamin D (Ergocalciferol) 50000 units Caps capsule Commonly known as:  DRISDOL Take 50,000 Units by mouth every 7 (seven) days. Sunday       No Known Allergies  Consultations:   Procedures/Studies: Dg Chest 2 View  Result Date:  09/13/2016 CLINICAL DATA:  Shortness of breath, cough and nausea, history of hypertension EXAM: CHEST  2 VIEW COMPARISON:  04/20/2015 FINDINGS: The heart size and mediastinal contours are within normal limits. Both lungs are clear. Mild degenerative osteophytes of the spine. No acute osseous abnormality. Post mastectomy changes on the right. IMPRESSION: No active cardiopulmonary disease. Electronically Signed   By: Donavan Foil M.D.   On: 09/13/2016 08:21     Subjective:The patient was seen and examined at bedside. Patient denied fever, chills, headache, chest pain, shortness of breath. She has dry cough. Denied nausea, vomiting, abdominal pain. Able to ambulate without difficulty.   Discharge Exam: Vitals:   09/14/16 0603 09/14/16 1103  BP: 117/73 113/67  Pulse: 77   Resp: 18   Temp: 98.5 F (36.9 C) 98.2 F (36.8 C)   Vitals:   09/14/16 0117 09/14/16 0603 09/14/16 0942 09/14/16 1103  BP: (!) 109/57 117/73  113/67  Pulse: 88 77    Resp: 18 18    Temp:  98.5 F (36.9 C)  98.2 F (36.8 C)  TempSrc:  Oral  Oral  SpO2: 98% 100% 97% 97%  Weight:  74.6 kg (164 lb 8 oz)    Height:        General: Pt is alert, awake, not in acute distress Cardiovascular: RRR, S1/S2 +, no rubs, no gallops Respiratory: Mild expiratory wheeze, no crackle. Abdominal: Soft, NT, ND, bowel sounds + Extremities: no edema, no cyanosis    The results of significant diagnostics from this hospitalization (including imaging, microbiology, ancillary and laboratory) are listed below for reference.     Microbiology: Recent Results (from the past 240 hour(s))  Respiratory Panel by PCR     Status: Abnormal   Collection Time: 09/13/16  5:39 PM  Result Value Ref Range Status   Adenovirus NOT DETECTED NOT DETECTED Final   Coronavirus 229E NOT DETECTED NOT DETECTED Final   Coronavirus HKU1 NOT DETECTED NOT DETECTED Final   Coronavirus NL63 NOT DETECTED NOT DETECTED Final   Coronavirus OC43 NOT DETECTED NOT  DETECTED Final   Metapneumovirus NOT DETECTED NOT DETECTED Final   Rhinovirus / Enterovirus DETECTED (A) NOT DETECTED Final   Influenza A NOT DETECTED NOT DETECTED Final   Influenza B NOT DETECTED NOT DETECTED Final   Parainfluenza Virus 1 NOT DETECTED NOT DETECTED Final   Parainfluenza Virus 2 NOT DETECTED NOT DETECTED Final   Parainfluenza Virus 3 NOT DETECTED NOT DETECTED Final   Parainfluenza Virus 4 NOT DETECTED NOT DETECTED Final   Respiratory Syncytial Virus NOT DETECTED NOT DETECTED Final   Bordetella pertussis NOT DETECTED NOT DETECTED Final   Chlamydophila pneumoniae NOT DETECTED NOT DETECTED Final   Mycoplasma pneumoniae NOT DETECTED NOT DETECTED Final     Labs: BNP (last 3 results) No results for input(s): BNP in the last 8760 hours. Basic Metabolic Panel:  Recent Labs Lab 09/13/16 0927 09/14/16 0150  NA 143 138  K 3.5 3.9  CL 110 108  CO2 23 24  GLUCOSE 81 133*  BUN 11 16  CREATININE 0.76 0.95  CALCIUM 9.8 8.9   Liver Function Tests: No results for input(s): AST, ALT, ALKPHOS, BILITOT, PROT, ALBUMIN in the last 168 hours. No results for input(s): LIPASE, AMYLASE in the last 168 hours. No results for input(s): AMMONIA in the last 168 hours. CBC:  Recent Labs Lab 09/13/16 0927 09/14/16 0150  WBC 7.5 8.3  NEUTROABS 4.8  --   HGB 13.6 11.1*  HCT 42.9 35.9*  MCV 84.0 84.1  PLT 247 220   Cardiac Enzymes: No results for input(s): CKTOTAL, CKMB, CKMBINDEX, TROPONINI in the last 168 hours. BNP: Invalid input(s): POCBNP CBG: No results for input(s): GLUCAP in the last 168 hours. D-Dimer No results for input(s): DDIMER in the last 72 hours. Hgb A1c No results for input(s): HGBA1C in the last 72 hours. Lipid Profile No results for input(s): CHOL, HDL, LDLCALC, TRIG, CHOLHDL, LDLDIRECT in the last 72 hours. Thyroid function studies No results for input(s): TSH, T4TOTAL, T3FREE, THYROIDAB in the last 72 hours.  Invalid input(s): FREET3 Anemia work  up No results for input(s): VITAMINB12, FOLATE, FERRITIN, TIBC, IRON, RETICCTPCT in the last 72 hours. Urinalysis No results found for: COLORURINE, APPEARANCEUR, LABSPEC, PHURINE, GLUCOSEU, HGBUR, BILIRUBINUR, KETONESUR, PROTEINUR, UROBILINOGEN, NITRITE, LEUKOCYTESUR Sepsis Labs Invalid input(s): PROCALCITONIN,  WBC,  LACTICIDVEN Microbiology Recent Results (from the past 240 hour(s))  Respiratory Panel by PCR     Status: Abnormal   Collection Time: 09/13/16  5:39 PM  Result Value Ref Range Status   Adenovirus NOT DETECTED NOT DETECTED Final   Coronavirus 229E NOT DETECTED NOT DETECTED Final   Coronavirus HKU1 NOT DETECTED NOT DETECTED Final   Coronavirus NL63 NOT DETECTED NOT DETECTED Final   Coronavirus OC43 NOT DETECTED NOT DETECTED Final   Metapneumovirus NOT DETECTED NOT DETECTED Final   Rhinovirus / Enterovirus DETECTED (A) NOT DETECTED Final   Influenza A NOT DETECTED NOT DETECTED Final   Influenza B NOT DETECTED NOT DETECTED Final   Parainfluenza Virus 1 NOT DETECTED NOT DETECTED Final   Parainfluenza Virus 2 NOT DETECTED NOT DETECTED Final   Parainfluenza Virus 3 NOT DETECTED NOT DETECTED Final   Parainfluenza Virus 4 NOT DETECTED NOT DETECTED Final   Respiratory Syncytial Virus NOT DETECTED NOT DETECTED Final   Bordetella pertussis NOT DETECTED NOT DETECTED Final   Chlamydophila pneumoniae NOT DETECTED NOT DETECTED Final   Mycoplasma pneumoniae NOT DETECTED NOT DETECTED Final     Time coordinating discharge: 26 minutes  SIGNED:   Rosita Fire, MD  [facility] 09/14/2016, 11:37 AM Pager   If 7PM-7AM, please contact night-coverage [email or web address] Password [email or web address]

## 2016-09-18 ENCOUNTER — Encounter: Payer: Self-pay | Admitting: Critical Care Medicine

## 2016-09-18 ENCOUNTER — Ambulatory Visit: Payer: Medicaid Other | Attending: Critical Care Medicine | Admitting: Critical Care Medicine

## 2016-09-18 VITALS — BP 132/78 | HR 77 | Temp 98.0°F | Resp 18 | Ht 68.0 in | Wt 162.6 lb

## 2016-09-18 DIAGNOSIS — Z853 Personal history of malignant neoplasm of breast: Secondary | ICD-10-CM | POA: Diagnosis not present

## 2016-09-18 DIAGNOSIS — H409 Unspecified glaucoma: Secondary | ICD-10-CM | POA: Insufficient documentation

## 2016-09-18 DIAGNOSIS — Z9011 Acquired absence of right breast and nipple: Secondary | ICD-10-CM | POA: Insufficient documentation

## 2016-09-18 DIAGNOSIS — J206 Acute bronchitis due to rhinovirus: Secondary | ICD-10-CM | POA: Diagnosis not present

## 2016-09-18 DIAGNOSIS — J4541 Moderate persistent asthma with (acute) exacerbation: Secondary | ICD-10-CM | POA: Diagnosis not present

## 2016-09-18 DIAGNOSIS — I10 Essential (primary) hypertension: Secondary | ICD-10-CM | POA: Diagnosis not present

## 2016-09-18 DIAGNOSIS — J4540 Moderate persistent asthma, uncomplicated: Secondary | ICD-10-CM | POA: Insufficient documentation

## 2016-09-18 DIAGNOSIS — J449 Chronic obstructive pulmonary disease, unspecified: Secondary | ICD-10-CM | POA: Diagnosis not present

## 2016-09-18 DIAGNOSIS — Z87828 Personal history of other (healed) physical injury and trauma: Secondary | ICD-10-CM | POA: Insufficient documentation

## 2016-09-18 DIAGNOSIS — J45901 Unspecified asthma with (acute) exacerbation: Secondary | ICD-10-CM

## 2016-09-18 MED ORDER — PREDNISONE 10 MG PO TABS: ORAL_TABLET | ORAL | 0 refills | Status: DC

## 2016-09-18 MED ORDER — AZITHROMYCIN 250 MG PO TABS: ORAL_TABLET | ORAL | 0 refills | Status: DC

## 2016-09-18 NOTE — Assessment & Plan Note (Signed)
AECOPD Ex smoker Poor HFA and DPI technique Plan Reeducated on DPI technique and HFA technique Take azithromycin 250mg :  Take two once then one daily until gone Extend prednisone further Stay on advair 250 one puff bid Stay on Spiriva daily Obtain PFTs

## 2016-09-18 NOTE — Progress Notes (Signed)
Patient is here for URI and SOB FU  Patient complains of congestion being present. Patient is currently taking tessalon and prednisone.  Patient has taken medication today and patient has eaten today.  Patient declined the flu shot today.  Patient denies suicidal ideations at this time.

## 2016-09-18 NOTE — Patient Instructions (Signed)
Take last prednisone 50mg  dose, then get new prednisone prescription filled and start  10mg  :   Take 4 for three days 3 for three days 2 for three days 1 for three days and stop   Take Azithromycin 250mg  Take two once then one daily until gone  Stay on Advair 250 one puff twice daily  Take spiriva one capsule two puff daily  Use albuterol in inhaler or nebulizer as needed  Return 2 months  Get lung function test done in next one month

## 2016-09-18 NOTE — Assessment & Plan Note (Signed)
Acute tracheobronchitis d/t rhinovirus AECOPD Ex smoker Poor HFA and DPI technique Plan Reeducated on DPI technique and HFA technique Take azithromycin 250mg :  Take two once then one daily until gone Extend prednisone further Stay on advair 250 one puff bid Stay on Spiriva daily Obtain PFTs Return 6 weeks

## 2016-09-18 NOTE — Assessment & Plan Note (Signed)
Pt with asthmatic bronchitis Need to determine airflow and chronicity  Plan  full pfts See other assessments
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
## 2016-09-19 ENCOUNTER — Other Ambulatory Visit: Payer: Self-pay | Admitting: Internal Medicine

## 2016-09-19 NOTE — Telephone Encounter (Signed)
Patient called the office to request refill for her blood pressure medication. Please follow up.  Thank you.

## 2016-09-22 MED ORDER — AMLODIPINE BESYLATE 5 MG PO TABS: 5.0000 mg | ORAL_TABLET | Freq: Every day | ORAL | 6 refills | Status: DC

## 2016-10-14 ENCOUNTER — Ambulatory Visit: Payer: Medicare Other | Admitting: Family Medicine

## 2016-10-18 ENCOUNTER — Inpatient Hospital Stay (HOSPITAL_COMMUNITY): Admission: RE | Admit: 2016-10-18 | Payer: Medicare Other | Source: Ambulatory Visit

## 2016-11-26 ENCOUNTER — Ambulatory Visit: Payer: Medicaid Other | Attending: Family Medicine | Admitting: Family Medicine

## 2016-11-26 ENCOUNTER — Encounter: Payer: Self-pay | Admitting: Licensed Clinical Social Worker

## 2016-11-26 ENCOUNTER — Encounter: Payer: Self-pay | Admitting: Family Medicine

## 2016-11-26 VITALS — BP 138/91 | HR 80 | Temp 97.8°F | Ht 68.0 in | Wt 167.0 lb

## 2016-11-26 DIAGNOSIS — Z79899 Other long term (current) drug therapy: Secondary | ICD-10-CM | POA: Diagnosis not present

## 2016-11-26 DIAGNOSIS — J449 Chronic obstructive pulmonary disease, unspecified: Secondary | ICD-10-CM | POA: Insufficient documentation

## 2016-11-26 DIAGNOSIS — E559 Vitamin D deficiency, unspecified: Secondary | ICD-10-CM | POA: Diagnosis not present

## 2016-11-26 DIAGNOSIS — E7800 Pure hypercholesterolemia, unspecified: Secondary | ICD-10-CM | POA: Insufficient documentation

## 2016-11-26 DIAGNOSIS — M51369 Other intervertebral disc degeneration, lumbar region without mention of lumbar back pain or lower extremity pain: Secondary | ICD-10-CM | POA: Insufficient documentation

## 2016-11-26 DIAGNOSIS — I10 Essential (primary) hypertension: Secondary | ICD-10-CM | POA: Insufficient documentation

## 2016-11-26 DIAGNOSIS — Z9889 Other specified postprocedural states: Secondary | ICD-10-CM | POA: Insufficient documentation

## 2016-11-26 DIAGNOSIS — H402234 Chronic angle-closure glaucoma, bilateral, indeterminate stage: Secondary | ICD-10-CM

## 2016-11-26 DIAGNOSIS — M5136 Other intervertebral disc degeneration, lumbar region: Secondary | ICD-10-CM | POA: Insufficient documentation

## 2016-11-26 DIAGNOSIS — Z0001 Encounter for general adult medical examination with abnormal findings: Secondary | ICD-10-CM | POA: Insufficient documentation

## 2016-11-26 DIAGNOSIS — G894 Chronic pain syndrome: Secondary | ICD-10-CM | POA: Insufficient documentation

## 2016-11-26 DIAGNOSIS — R634 Abnormal weight loss: Secondary | ICD-10-CM | POA: Insufficient documentation

## 2016-11-26 DIAGNOSIS — H4020X4 Unspecified primary angle-closure glaucoma, indeterminate stage: Secondary | ICD-10-CM | POA: Insufficient documentation

## 2016-11-26 DIAGNOSIS — K279 Peptic ulcer, site unspecified, unspecified as acute or chronic, without hemorrhage or perforation: Secondary | ICD-10-CM | POA: Insufficient documentation

## 2016-11-26 LAB — TSH: TSH: 0.39 m[IU]/L — AB

## 2016-11-26 MED ORDER — ATORVASTATIN CALCIUM 20 MG PO TABS: 20.0000 mg | ORAL_TABLET | Freq: Every day | ORAL | 3 refills | Status: DC

## 2016-11-26 NOTE — BH Specialist Note (Signed)
Session Start time: 9:55 am   End Time: 10:20 pm Total Time:  25 minutes Type of Service: [place]: No.   Interpreter Name & Language: N/A # [facility] Visits July 2017-June 2018: 1st   SUBJECTIVE: Leslie Allen is a 64 y.o. female  Pt. was referred by Dr. Jarold Song for:  grief support. Pt. reports the following symptoms/concerns: feelings of sadness Duration of problem:  January 2014 Severity: moderate Previous treatment: None reported   OBJECTIVE: Mood: Pleasant & Affect: Appropriate Risk of harm to self or others: Pt denied SI/HI Assessments administered: PHQ-9; GAD-7  LIFE CONTEXT:  Family & Social: Pt has 2 sons who reside nearby in [place] and 3 sons who reside in [place].  School/ Work: Pt is unemployed Self-Care: Pt did not report difficulty in sleeping or loss of appetite. No report of substance use Life changes: Pt is grieving the loss of her youngest son who passed approx January 2014. Pt visits son's cemetary monthly for a week.  What is important to pt/family (values): Family   GOALS ADDRESSED:  Obtain grief support  INTERVENTIONS: Solution Focused, Strength-based and Supportive   ASSESSMENT:  Pt currently experiencing feelings of sadness triggered by the loss of youngest son. Pt reports visiting [place] every month for a week to visit son at [employer].  Pt may benefit from psycho education and psychotherapy. Pt is not interested in medication management at present time. [place] educated pt on the stages of grief and encouraged her to participate in grief counseling and support. Pt was provided grief resources. LCSWA engaged pt on healthy coping skills. Pt identified multiple healthy coping skills to utilize on a weekly basis to manage overwhelming feelings of sadness. Crisis resources were provided.        PLAN: 1. F/U with behavioral health clinician: Pt was encouraged to contact [place] if symptoms worsen or fail to improve to schedule  behavioral appointments at [facility] 2. Behavioral Health meds: None reported 3. Behavioral recommendations: LCSWA recommends that pt apply healthy coping skills discussed, initiate grief counseling, and schedule follow up appointment with LCSWA 4. Referral: Brief Counseling/Psychotherapy, [employer], Problem-solving teaching/coping strategies, Psychoeducation and Supportive Counseling 5. From scale of 1-10, how likely are you to follow plan: 7/10   Rebekah Chesterfield, MSW, [place] Worker 11/26/16 11:50 am  Warmhandoff:   Warm Hand Off Completed.
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
## 2016-11-26 NOTE — Progress Notes (Signed)
Med refills

## 2016-11-26 NOTE — Progress Notes (Signed)
Patient was given records release form to fill out.  Patient is sure she wants to change physicians because she goes back to [place] once a month to visit her son's grave site and see's her physician in [place] then.  Patient took the form and will think about switching MD's.

## 2016-11-27 ENCOUNTER — Encounter: Payer: Self-pay | Admitting: Family Medicine

## 2016-11-27 DIAGNOSIS — R7989 Other specified abnormal findings of blood chemistry: Secondary | ICD-10-CM | POA: Insufficient documentation

## 2016-11-27 LAB — VITAMIN D 25 HYDROXY (VIT D DEFICIENCY, FRACTURES): VIT D 25 HYDROXY: 31 ng/mL (ref 30–100)

## 2016-12-05 NOTE — Progress Notes (Signed)
After several attempts to contact patient, writer sent her a letter informing her of recent lab results.

## 2016-12-19 ENCOUNTER — Encounter (HOSPITAL_COMMUNITY): Payer: Self-pay

## 2016-12-19 ENCOUNTER — Emergency Department (HOSPITAL_COMMUNITY)
Admission: EM | Admit: 2016-12-19 | Discharge: 2016-12-19 | Disposition: A | Discharge status: Home / Self Care | Payer: Medicare Other | Attending: Emergency Medicine | Admitting: Emergency Medicine

## 2016-12-19 ENCOUNTER — Emergency Department (HOSPITAL_COMMUNITY): Payer: Medicare Other

## 2016-12-19 DIAGNOSIS — J449 Chronic obstructive pulmonary disease, unspecified: Secondary | ICD-10-CM | POA: Diagnosis not present

## 2016-12-19 DIAGNOSIS — Z79899 Other long term (current) drug therapy: Secondary | ICD-10-CM | POA: Insufficient documentation

## 2016-12-19 DIAGNOSIS — R072 Precordial pain: Secondary | ICD-10-CM

## 2016-12-19 DIAGNOSIS — I10 Essential (primary) hypertension: Secondary | ICD-10-CM | POA: Diagnosis not present

## 2016-12-19 DIAGNOSIS — E876 Hypokalemia: Secondary | ICD-10-CM

## 2016-12-19 DIAGNOSIS — R0789 Other chest pain: Secondary | ICD-10-CM

## 2016-12-19 DIAGNOSIS — Z853 Personal history of malignant neoplasm of breast: Secondary | ICD-10-CM | POA: Diagnosis not present

## 2016-12-19 DIAGNOSIS — R079 Chest pain, unspecified: Secondary | ICD-10-CM | POA: Diagnosis present

## 2016-12-19 LAB — BASIC METABOLIC PANEL
Anion gap: 9 (ref 5–15)
BUN: 13 mg/dL (ref 6–20)
CHLORIDE: 105 mmol/L (ref 101–111)
CO2: 25 mmol/L (ref 22–32)
CREATININE: 0.79 mg/dL (ref 0.44–1.00)
Calcium: 9.6 mg/dL (ref 8.9–10.3)
GFR calc Af Amer: >60 mL/min (ref >60)
GFR calc non Af Amer: >60 mL/min (ref >60)
GLUCOSE: 105 mg/dL — AB (ref 65–99)
POTASSIUM: 3.2 mmol/L — AB (ref 3.5–5.1)
SODIUM: 139 mmol/L (ref 135–145)

## 2016-12-19 LAB — CBC
HCT: 40.6 % (ref 36.0–46.0)
Hemoglobin: 13.1 g/dL (ref 12.0–15.0)
MCH: 26.4 pg (ref 26.0–34.0)
MCHC: 32.3 g/dL (ref 30.0–36.0)
MCV: 81.7 fL (ref 78.0–100.0)
PLATELETS: 228 10*3/uL (ref 150–400)
RBC: 4.97 MIL/uL (ref 3.87–5.11)
RDW: 15.6 % — ABNORMAL HIGH (ref 11.5–15.5)
WBC: 8.2 10*3/uL (ref 4.0–10.5)

## 2016-12-19 LAB — I-STAT TROPONIN, ED: TROPONIN I, POC: 0 ng/mL (ref 0.00–0.08)

## 2016-12-19 MED ORDER — IBUPROFEN 200 MG PO TABS
200.0000 mg | ORAL_TABLET | Freq: Once | ORAL | Status: AC
  Administered 2016-12-19: 200 mg via ORAL
  Filled 2016-12-19: qty 1

## 2016-12-19 MED ORDER — POTASSIUM CHLORIDE CRYS ER 20 MEQ PO TBCR
40.0000 meq | EXTENDED_RELEASE_TABLET | Freq: Once | ORAL | Status: AC
  Administered 2016-12-19: 40 meq via ORAL
  Filled 2016-12-19: qty 2

## 2016-12-19 NOTE — ED Triage Notes (Signed)
Pt reports chest pain onset 1 hour ago. She initially thought it was gas. Pt denies any radiation. Pain is worse with movement.
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
## 2016-12-19 NOTE — ED Notes (Signed)
Pt stable, understands discharge instructions, and reasons for return.   

## 2016-12-19 NOTE — Discharge Instructions (Signed)
It was our pleasure to provide your ER care today - we hope that you feel better.  Take tylenol or advil as need for pain.  From today's lab tests, your potassium level is mildly low (3.2) - eat plenty of fruits and vegetables, and follow up with primary care doctor in the next 1-2 weeks.  For chest discomfort, follow up with cardiologist in the next 1-2 weeks - see referral - call office to arrange appointment.  Return to ER if worse, recurrent or persistent chest pain, trouble breathing, high fevers, other concern.

## 2016-12-31 ENCOUNTER — Ambulatory Visit: Payer: Medicare (Managed Care) | Attending: Family Medicine | Admitting: Family Medicine

## 2016-12-31 ENCOUNTER — Encounter: Payer: Self-pay | Admitting: Family Medicine

## 2016-12-31 VITALS — BP 142/78 | HR 66 | Temp 97.4°F | Ht 68.0 in | Wt 164.4 lb

## 2016-12-31 DIAGNOSIS — R232 Flushing: Secondary | ICD-10-CM

## 2016-12-31 DIAGNOSIS — Z5189 Encounter for other specified aftercare: Secondary | ICD-10-CM | POA: Insufficient documentation

## 2016-12-31 DIAGNOSIS — I10 Essential (primary) hypertension: Secondary | ICD-10-CM | POA: Diagnosis not present

## 2016-12-31 DIAGNOSIS — R634 Abnormal weight loss: Secondary | ICD-10-CM

## 2016-12-31 DIAGNOSIS — R7989 Other specified abnormal findings of blood chemistry: Secondary | ICD-10-CM

## 2016-12-31 DIAGNOSIS — N951 Menopausal and female climacteric states: Secondary | ICD-10-CM | POA: Diagnosis not present

## 2016-12-31 DIAGNOSIS — Z79899 Other long term (current) drug therapy: Secondary | ICD-10-CM | POA: Diagnosis not present

## 2016-12-31 DIAGNOSIS — R946 Abnormal results of thyroid function studies: Secondary | ICD-10-CM

## 2016-12-31 LAB — TSH: TSH: 0.48 mIU/L

## 2016-12-31 MED ORDER — CALCIUM GLUCONATE 500 MG PO TABS: 1.0000 | ORAL_TABLET | Freq: Two times a day (BID) | ORAL | 2 refills | Status: DC

## 2016-12-31 MED ORDER — CLONIDINE HCL 0.1 MG PO TABS: 0.1000 mg | ORAL_TABLET | Freq: Every day | ORAL | 3 refills | Status: DC
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
## 2017-01-02 ENCOUNTER — Telehealth: Payer: Self-pay

## 2017-01-02 NOTE — Telephone Encounter (Signed)
-----   Message from Arnoldo Morale, MD sent at 01/01/2017  8:44 AM EST ----- Please inform the patient that labs are normal. Thank you.

## 2017-01-02 NOTE — Telephone Encounter (Signed)
Writer spoke with patient and discussed lab results.  Patient stated understanding.

## 2017-02-14 ENCOUNTER — Encounter: Payer: Self-pay | Admitting: Family Medicine

## 2017-02-14 ENCOUNTER — Ambulatory Visit: Payer: Medicare (Managed Care) | Attending: Family Medicine | Admitting: Family Medicine

## 2017-02-14 VITALS — BP 111/75 | HR 74 | Temp 97.2°F | Resp 18 | Ht 68.0 in | Wt 163.8 lb

## 2017-02-14 DIAGNOSIS — Z79899 Other long term (current) drug therapy: Secondary | ICD-10-CM | POA: Diagnosis not present

## 2017-02-14 DIAGNOSIS — Z8711 Personal history of peptic ulcer disease: Secondary | ICD-10-CM | POA: Insufficient documentation

## 2017-02-14 DIAGNOSIS — E785 Hyperlipidemia, unspecified: Secondary | ICD-10-CM | POA: Insufficient documentation

## 2017-02-14 DIAGNOSIS — I10 Essential (primary) hypertension: Secondary | ICD-10-CM | POA: Diagnosis not present

## 2017-02-14 DIAGNOSIS — Z853 Personal history of malignant neoplasm of breast: Secondary | ICD-10-CM | POA: Insufficient documentation

## 2017-02-14 DIAGNOSIS — Z9889 Other specified postprocedural states: Secondary | ICD-10-CM | POA: Diagnosis not present

## 2017-02-14 DIAGNOSIS — H409 Unspecified glaucoma: Secondary | ICD-10-CM | POA: Diagnosis not present

## 2017-02-14 DIAGNOSIS — J4541 Moderate persistent asthma with (acute) exacerbation: Secondary | ICD-10-CM | POA: Diagnosis not present

## 2017-02-14 DIAGNOSIS — Z0001 Encounter for general adult medical examination with abnormal findings: Secondary | ICD-10-CM | POA: Insufficient documentation

## 2017-02-14 DIAGNOSIS — J209 Acute bronchitis, unspecified: Secondary | ICD-10-CM | POA: Insufficient documentation

## 2017-02-14 DIAGNOSIS — J208 Acute bronchitis due to other specified organisms: Secondary | ICD-10-CM | POA: Diagnosis not present

## 2017-02-14 MED ORDER — PROMETHAZINE-DM 6.25-15 MG/5ML PO SYRP: 5.0000 mL | ORAL_SOLUTION | Freq: Four times a day (QID) | ORAL | 0 refills | Status: DC | PRN

## 2017-02-14 MED ORDER — ALBUTEROL SULFATE HFA 108 (90 BASE) MCG/ACT IN AERS: 1.0000 | INHALATION_SPRAY | Freq: Four times a day (QID) | RESPIRATORY_TRACT | 3 refills | Status: DC | PRN

## 2017-02-14 MED ORDER — AZITHROMYCIN 250 MG PO TABS: ORAL_TABLET | ORAL | 0 refills | Status: DC

## 2017-02-14 MED ORDER — FLUTICASONE-SALMETEROL 250-50 MCG/DOSE IN AEPB: 1.0000 | INHALATION_SPRAY | Freq: Two times a day (BID) | RESPIRATORY_TRACT | 3 refills | Status: DC
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
## 2017-02-14 NOTE — Progress Notes (Signed)
Patient is here for cold symptoms  Patient complains of cold symptoms being present for the past 2 weeks.  Patient has taken medication today. Patient has eaten today.  Patient request refill on albuterol inhaler.

## 2017-03-17 ENCOUNTER — Telehealth: Payer: Self-pay | Admitting: Family Medicine

## 2017-03-17 NOTE — Telephone Encounter (Signed)
Patient called the office to speak with PCP regarding getting a referral to a pain management clinic. Please follow up.  Thank you.

## 2017-03-18 NOTE — Telephone Encounter (Signed)
She will need to come in for an office visit so we can discuss pain management options here in the clinic prior to placing a referral

## 2017-03-20 NOTE — Telephone Encounter (Signed)
Patient has an appt on 03/26/17.

## 2017-03-26 ENCOUNTER — Other Ambulatory Visit: Payer: Self-pay | Admitting: Family Medicine

## 2017-03-26 ENCOUNTER — Encounter: Payer: Self-pay | Admitting: Family Medicine

## 2017-03-26 ENCOUNTER — Ambulatory Visit: Payer: Medicaid - Out of State | Attending: Family Medicine | Admitting: Family Medicine

## 2017-03-26 ENCOUNTER — Ambulatory Visit (HOSPITAL_COMMUNITY)
Admission: RE | Admit: 2017-03-26 | Discharge: 2017-03-26 | Disposition: A | Discharge status: Home / Self Care | Payer: Medicare (Managed Care) | Source: Ambulatory Visit | Attending: Family Medicine | Admitting: Family Medicine

## 2017-03-26 VITALS — BP 123/84 | HR 64 | Temp 97.5°F | Ht 68.0 in | Wt 173.0 lb

## 2017-03-26 DIAGNOSIS — M25561 Pain in right knee: Secondary | ICD-10-CM | POA: Insufficient documentation

## 2017-03-26 DIAGNOSIS — G894 Chronic pain syndrome: Secondary | ICD-10-CM

## 2017-03-26 DIAGNOSIS — Z853 Personal history of malignant neoplasm of breast: Secondary | ICD-10-CM | POA: Insufficient documentation

## 2017-03-26 DIAGNOSIS — Z79891 Long term (current) use of opiate analgesic: Secondary | ICD-10-CM | POA: Diagnosis not present

## 2017-03-26 DIAGNOSIS — Z8711 Personal history of peptic ulcer disease: Secondary | ICD-10-CM | POA: Diagnosis not present

## 2017-03-26 DIAGNOSIS — I10 Essential (primary) hypertension: Secondary | ICD-10-CM | POA: Diagnosis not present

## 2017-03-26 DIAGNOSIS — J302 Other seasonal allergic rhinitis: Secondary | ICD-10-CM | POA: Diagnosis not present

## 2017-03-26 DIAGNOSIS — R05 Cough: Secondary | ICD-10-CM | POA: Insufficient documentation

## 2017-03-26 DIAGNOSIS — M5136 Other intervertebral disc degeneration, lumbar region: Secondary | ICD-10-CM | POA: Insufficient documentation

## 2017-03-26 DIAGNOSIS — Z9010 Acquired absence of unspecified breast and nipple: Secondary | ICD-10-CM | POA: Diagnosis not present

## 2017-03-26 DIAGNOSIS — Z79899 Other long term (current) drug therapy: Secondary | ICD-10-CM | POA: Insufficient documentation

## 2017-03-26 DIAGNOSIS — Z9889 Other specified postprocedural states: Secondary | ICD-10-CM | POA: Insufficient documentation

## 2017-03-26 DIAGNOSIS — G8929 Other chronic pain: Secondary | ICD-10-CM | POA: Diagnosis not present

## 2017-03-26 DIAGNOSIS — Z0001 Encounter for general adult medical examination with abnormal findings: Secondary | ICD-10-CM | POA: Diagnosis present

## 2017-03-26 DIAGNOSIS — E785 Hyperlipidemia, unspecified: Secondary | ICD-10-CM | POA: Insufficient documentation

## 2017-03-26 DIAGNOSIS — J449 Chronic obstructive pulmonary disease, unspecified: Secondary | ICD-10-CM | POA: Diagnosis not present

## 2017-03-26 MED ORDER — CETIRIZINE HCL 10 MG PO TABS: 10.0000 mg | ORAL_TABLET | Freq: Every day | ORAL | 3 refills | Status: DC

## 2017-03-26 MED ORDER — MELOXICAM 7.5 MG PO TABS: 7.5000 mg | ORAL_TABLET | Freq: Every day | ORAL | 0 refills | Status: DC

## 2017-03-26 MED ORDER — BENZONATATE 100 MG PO CAPS: 100.0000 mg | ORAL_CAPSULE | Freq: Two times a day (BID) | ORAL | 0 refills | Status: DC | PRN
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
## 2017-03-26 NOTE — Patient Instructions (Signed)
Knee Pain, Adult Knee pain in adults is common. It can be caused by many things, including:  Arthritis.  A fluid-filled sac (cyst) or growth in your knee.  An infection in your knee.  An injury that will not heal.  Damage, swelling, or irritation of the tissues that support your knee. Knee pain is usually not a sign of a serious problem. The pain may go away on its own with time and rest. If it does not, a health care provider may order tests to find the cause of the pain. These may include:  Imaging tests, such as an X-ray, MRI, or ultrasound.  Joint aspiration. In this test, fluid is removed from the knee.  Arthroscopy. In this test, a lighted tube is inserted into knee and an image is projected onto a TV screen.  A biopsy. In this test, a sample of tissue is removed from the body and studied under a microscope. Follow these instructions at home: Pay attention to any changes in your symptoms. Take these actions to relieve your pain. Activity   Rest your knee.  Do not do things that cause pain or make pain worse.  Avoid high-impact activities or exercises, such as running, jumping rope, or doing jumping jacks. General instructions   Take over-the-counter and prescription medicines only as told by your health care provider.  Raise (elevate) your knee above the level of your heart when you are sitting or lying down.  Sleep with a pillow under your knee.  If directed, apply ice to the knee:  Put ice in a plastic bag.  Place a towel between your skin and the bag.  Leave the ice on for 20 minutes, 2-3 times a day.  Ask your health care provider if you should wear an elastic knee support.  Lose weight if you are overweight. Extra weight can put pressure on your knee.  Do not use any products that contain nicotine or tobacco, such as cigarettes and e-cigarettes. Smoking may slow the healing of any bone and joint problems that you may have. If you need help quitting, ask  your health care provider. Contact a health care provider if:  Your knee pain continues, changes, or gets worse.  You have a fever along with knee pain.  Your knee buckles or locks up.  Your knee swells, and the swelling becomes worse. Get help right away if:  Your knee feels warm to the touch.  You cannot move your knee.  You have severe pain in your knee.  You have chest pain.  You have trouble breathing. Summary  Knee pain in adults is common. It can be caused by many things, including, arthritis, infection, cysts, or injury.  Knee pain is usually not a sign of a serious problem, but if it does not go away, a health care provider may perform tests to know the cause of the pain.  Pay attention to any changes in your symptoms. Relieve your pain with rest, medicines, light activity, and use of ice.  Get help if your pain continues or becomes very severe, or if your knee buckles or locks up, or if you have chest pain or trouble breathing. This information is not intended to replace advice given to you by your health care provider. Make sure you discuss any questions you have with your health care provider. Document Released: 10/13/2007 Document Revised: 12/06/2016 Document Reviewed: 12/06/2016 Elsevier Interactive Patient Education  2017 [employer].

## 2017-03-26 NOTE — Progress Notes (Signed)
Would like some cough syrup- "codeine"

## 2017-04-02 ENCOUNTER — Telehealth: Payer: Self-pay

## 2017-04-02 DIAGNOSIS — M25561 Pain in right knee: Secondary | ICD-10-CM

## 2017-04-02 NOTE — Telephone Encounter (Signed)
-----   Message from Arnoldo Morale, MD sent at 03/26/2017  1:04 PM EDT ----- X-rays is negative for fracture, dislocation or effusion.

## 2017-04-02 NOTE — Telephone Encounter (Signed)
Writer called patient with results of her knee xray, which came back normal.  Patient states she was up all night in pain and states that she has something going on with that knee.  Patient is asking to see a specialist.  Writer encouraged her to make an appt with Dr. Jarold Song to discuss.

## 2017-04-08 NOTE — Telephone Encounter (Signed)
Referral placed.

## 2017-04-08 NOTE — Addendum Note (Signed)
Addended by: Arnoldo Morale on: 04/08/2017 01:51 PM   Modules accepted: Orders

## 2017-04-15 ENCOUNTER — Encounter (INDEPENDENT_AMBULATORY_CARE_PROVIDER_SITE_OTHER): Payer: Self-pay | Admitting: Orthopaedic Surgery

## 2017-04-15 ENCOUNTER — Ambulatory Visit (INDEPENDENT_AMBULATORY_CARE_PROVIDER_SITE_OTHER): Payer: Medicare Other | Admitting: Orthopaedic Surgery

## 2017-04-15 DIAGNOSIS — M25561 Pain in right knee: Secondary | ICD-10-CM

## 2017-04-15 DIAGNOSIS — G8929 Other chronic pain: Secondary | ICD-10-CM

## 2017-04-15 MED ORDER — MELOXICAM 7.5 MG PO TABS: 7.5000 mg | ORAL_TABLET | Freq: Two times a day (BID) | ORAL | 2 refills | Status: DC | PRN
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
## 2017-04-28 ENCOUNTER — Observation Stay (HOSPITAL_COMMUNITY)
Admission: EM | Admit: 2017-04-28 | Discharge: 2017-04-30 | Disposition: A | Discharge status: Home / Self Care | Payer: Medicare (Managed Care) | Attending: Internal Medicine | Admitting: Internal Medicine

## 2017-04-28 ENCOUNTER — Emergency Department (HOSPITAL_COMMUNITY): Payer: Medicare (Managed Care)

## 2017-04-28 ENCOUNTER — Encounter (HOSPITAL_COMMUNITY): Payer: Self-pay

## 2017-04-28 DIAGNOSIS — R4781 Slurred speech: Secondary | ICD-10-CM

## 2017-04-28 DIAGNOSIS — H409 Unspecified glaucoma: Secondary | ICD-10-CM | POA: Diagnosis not present

## 2017-04-28 DIAGNOSIS — R29810 Facial weakness: Secondary | ICD-10-CM | POA: Insufficient documentation

## 2017-04-28 DIAGNOSIS — R270 Ataxia, unspecified: Secondary | ICD-10-CM | POA: Diagnosis not present

## 2017-04-28 DIAGNOSIS — F1210 Cannabis abuse, uncomplicated: Secondary | ICD-10-CM | POA: Diagnosis not present

## 2017-04-28 DIAGNOSIS — H02402 Unspecified ptosis of left eyelid: Secondary | ICD-10-CM | POA: Insufficient documentation

## 2017-04-28 DIAGNOSIS — Z853 Personal history of malignant neoplasm of breast: Secondary | ICD-10-CM | POA: Insufficient documentation

## 2017-04-28 DIAGNOSIS — Z9010 Acquired absence of unspecified breast and nipple: Secondary | ICD-10-CM | POA: Insufficient documentation

## 2017-04-28 DIAGNOSIS — F17210 Nicotine dependence, cigarettes, uncomplicated: Secondary | ICD-10-CM | POA: Diagnosis not present

## 2017-04-28 DIAGNOSIS — E785 Hyperlipidemia, unspecified: Secondary | ICD-10-CM | POA: Diagnosis not present

## 2017-04-28 DIAGNOSIS — R55 Syncope and collapse: Secondary | ICD-10-CM | POA: Diagnosis not present

## 2017-04-28 DIAGNOSIS — J449 Chronic obstructive pulmonary disease, unspecified: Secondary | ICD-10-CM | POA: Diagnosis not present

## 2017-04-28 DIAGNOSIS — Z79899 Other long term (current) drug therapy: Secondary | ICD-10-CM | POA: Insufficient documentation

## 2017-04-28 DIAGNOSIS — G894 Chronic pain syndrome: Secondary | ICD-10-CM | POA: Diagnosis not present

## 2017-04-28 DIAGNOSIS — G4733 Obstructive sleep apnea (adult) (pediatric): Secondary | ICD-10-CM | POA: Diagnosis not present

## 2017-04-28 DIAGNOSIS — I10 Essential (primary) hypertension: Secondary | ICD-10-CM | POA: Diagnosis present

## 2017-04-28 DIAGNOSIS — J4540 Moderate persistent asthma, uncomplicated: Secondary | ICD-10-CM | POA: Insufficient documentation

## 2017-04-28 DIAGNOSIS — G459 Transient cerebral ischemic attack, unspecified: Secondary | ICD-10-CM | POA: Diagnosis not present

## 2017-04-28 DIAGNOSIS — H5340 Unspecified visual field defects: Secondary | ICD-10-CM | POA: Diagnosis not present

## 2017-04-28 DIAGNOSIS — J4489 Other specified chronic obstructive pulmonary disease: Secondary | ICD-10-CM | POA: Diagnosis present

## 2017-04-28 DIAGNOSIS — I639 Cerebral infarction, unspecified: Secondary | ICD-10-CM

## 2017-04-28 LAB — URINALYSIS, ROUTINE W REFLEX MICROSCOPIC
Bacteria, UA: NONE SEEN
Bilirubin Urine: NEGATIVE
Glucose, UA: NEGATIVE mg/dL
Hgb urine dipstick: NEGATIVE
KETONES UR: NEGATIVE mg/dL
Leukocytes, UA: NEGATIVE
Nitrite: NEGATIVE
PH: 5 (ref 5.0–8.0)
Protein, ur: 30 mg/dL — AB
SPECIFIC GRAVITY, URINE: 1.013 (ref 1.005–1.030)

## 2017-04-28 LAB — I-STAT TROPONIN, ED: TROPONIN I, POC: 0 ng/mL (ref 0.00–0.08)

## 2017-04-28 LAB — BASIC METABOLIC PANEL
ANION GAP: 7 (ref 5–15)
BUN: 13 mg/dL (ref 6–20)
CALCIUM: 9.3 mg/dL (ref 8.9–10.3)
CO2: 25 mmol/L (ref 22–32)
CREATININE: 0.96 mg/dL (ref 0.44–1.00)
Chloride: 107 mmol/L (ref 101–111)
GFR calc non Af Amer: 60 mL/min — ABNORMAL LOW (ref >60)
GLUCOSE: 92 mg/dL (ref 65–99)
Potassium: 3.7 mmol/L (ref 3.5–5.1)
Sodium: 139 mmol/L (ref 135–145)

## 2017-04-28 LAB — CBC
HCT: 41.6 % (ref 36.0–46.0)
Hemoglobin: 13.5 g/dL (ref 12.0–15.0)
MCH: 26.3 pg (ref 26.0–34.0)
MCHC: 32.5 g/dL (ref 30.0–36.0)
MCV: 80.9 fL (ref 78.0–100.0)
PLATELETS: 225 10*3/uL (ref 150–400)
RBC: 5.14 MIL/uL — ABNORMAL HIGH (ref 3.87–5.11)
RDW: 15.6 % — ABNORMAL HIGH (ref 11.5–15.5)
WBC: 6.7 10*3/uL (ref 4.0–10.5)

## 2017-04-28 LAB — PROTIME-INR
INR: 0.97
PROTHROMBIN TIME: 12.8 s (ref 11.4–15.2)

## 2017-04-28 LAB — I-STAT CHEM 8, ED
BUN: 15 mg/dL (ref 6–20)
CREATININE: 1 mg/dL (ref 0.44–1.00)
Calcium, Ion: 1.19 mmol/L (ref 1.15–1.40)
Chloride: 106 mmol/L (ref 101–111)
GLUCOSE: 88 mg/dL (ref 65–99)
HCT: 42 % (ref 36.0–46.0)
HEMOGLOBIN: 14.3 g/dL (ref 12.0–15.0)
Potassium: 3.7 mmol/L (ref 3.5–5.1)
Sodium: 140 mmol/L (ref 135–145)
TCO2: 24 mmol/L (ref 0–100)

## 2017-04-28 LAB — DIFFERENTIAL
BASOS PCT: 0 %
Basophils Absolute: 0 10*3/uL (ref 0.0–0.1)
EOS ABS: 0.2 10*3/uL (ref 0.0–0.7)
EOS PCT: 2 %
Lymphocytes Relative: 45 %
Lymphs Abs: 3 10*3/uL (ref 0.7–4.0)
MONO ABS: 0.3 10*3/uL (ref 0.1–1.0)
Monocytes Relative: 5 %
Neutro Abs: 3.2 10*3/uL (ref 1.7–7.7)
Neutrophils Relative %: 48 %

## 2017-04-28 LAB — RAPID URINE DRUG SCREEN, HOSP PERFORMED
Amphetamines: NOT DETECTED
Barbiturates: NOT DETECTED
Benzodiazepines: NOT DETECTED
Cocaine: NOT DETECTED
OPIATES: NOT DETECTED
Tetrahydrocannabinol: POSITIVE — AB

## 2017-04-28 LAB — CBG MONITORING, ED
GLUCOSE-CAPILLARY: 95 mg/dL (ref 65–99)
GLUCOSE-CAPILLARY: 89 mg/dL (ref 65–99)

## 2017-04-28 LAB — APTT: APTT: 30 s (ref 24–36)

## 2017-04-28 MED ORDER — SODIUM CHLORIDE 0.9 % IV SOLN
Freq: Once | INTRAVENOUS | Status: AC
  Administered 2017-04-29: 04:00:00 via INTRAVENOUS
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
## 2017-04-28 NOTE — ED Notes (Signed)
Spoke with CT due to nursing communication per EDP for expedition of CT. Per Maudie Mercury in [place] pt is 4th in queue.

## 2017-04-28 NOTE — ED Triage Notes (Signed)
Per Pt, Pt is coming from home with husband. Pt reports having twp syncopal episodes in the last two hours. Pt reports noting BP low. Slurred speech is noted and a very slight facial droop upon neuro assessment. Husband noted that he had heard the slurred speech earlier in the afternoon and pt reports she has felt fatigued and drowsy for a couple days.

## 2017-04-28 NOTE — ED Notes (Signed)
ED Provider at bedside. 
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
## 2017-04-28 NOTE — ED Notes (Addendum)
Pt A&Ox4. Per pt's fiance, he has noticed pt "acting funny" being "weak and fatigued" for a few days now. Pt's fiance reports he noticed her slurred speech yesterday but that it is intermittent and seems worse today. Facial symmetry noted however mild L asymmetry noted when pt speaking, grips equal and strong. Mild dysarthria noted. Will notify Dr. Johnney Killian.

## 2017-04-28 NOTE — ED Notes (Signed)
Pt made aware of bed assignment 

## 2017-04-28 NOTE — ED Notes (Signed)
Patient transported to CT 

## 2017-04-28 NOTE — ED Notes (Signed)
Patient returned from CT

## 2017-04-28 NOTE — ED Notes (Signed)
Dr. Johnney Killian aware of pt status.

## 2017-04-29 ENCOUNTER — Encounter (HOSPITAL_COMMUNITY): Payer: Self-pay | Admitting: Internal Medicine

## 2017-04-29 ENCOUNTER — Observation Stay (HOSPITAL_COMMUNITY): Payer: Medicare (Managed Care)

## 2017-04-29 ENCOUNTER — Ambulatory Visit (HOSPITAL_COMMUNITY): Payer: Medicare (Managed Care)

## 2017-04-29 DIAGNOSIS — R4781 Slurred speech: Secondary | ICD-10-CM

## 2017-04-29 DIAGNOSIS — R55 Syncope and collapse: Secondary | ICD-10-CM | POA: Diagnosis not present

## 2017-04-29 DIAGNOSIS — G458 Other transient cerebral ischemic attacks and related syndromes: Secondary | ICD-10-CM

## 2017-04-29 DIAGNOSIS — I10 Essential (primary) hypertension: Secondary | ICD-10-CM

## 2017-04-29 LAB — CREATININE, SERUM: CREATININE: 0.83 mg/dL (ref 0.44–1.00)

## 2017-04-29 LAB — CBC
HCT: 40.3 % (ref 36.0–46.0)
HEMOGLOBIN: 13.2 g/dL (ref 12.0–15.0)
MCH: 26.3 pg (ref 26.0–34.0)
MCHC: 32.8 g/dL (ref 30.0–36.0)
MCV: 80.3 fL (ref 78.0–100.0)
PLATELETS: 229 10*3/uL (ref 150–400)
RBC: 5.02 MIL/uL (ref 3.87–5.11)
RDW: 15.3 % (ref 11.5–15.5)
WBC: 9 10*3/uL (ref 4.0–10.5)

## 2017-04-29 LAB — LIPID PANEL
CHOLESTEROL: 204 mg/dL — AB (ref 0–200)
HDL: 52 mg/dL (ref >40)
LDL Cholesterol: 126 mg/dL — ABNORMAL HIGH (ref 0–99)
TRIGLYCERIDES: 128 mg/dL (ref <150)
Total CHOL/HDL Ratio: 3.9 RATIO
VLDL: 26 mg/dL (ref 0–40)

## 2017-04-29 MED ORDER — OXYCODONE-ACETAMINOPHEN 5-325 MG PO TABS
1.0000 | ORAL_TABLET | ORAL | Status: DC | PRN
  Administered 2017-04-29 – 2017-04-30 (×5): 2 via ORAL
  Filled 2017-04-29 (×5): qty 2

## 2017-04-29 MED ORDER — OXYCODONE HCL 5 MG PO TABS
5.0000 mg | ORAL_TABLET | ORAL | Status: DC | PRN
  Administered 2017-04-29 – 2017-04-30 (×6): 10 mg via ORAL
  Filled 2017-04-29 (×6): qty 2

## 2017-04-29 MED ORDER — METHOCARBAMOL 500 MG PO TABS
500.0000 mg | ORAL_TABLET | Freq: Four times a day (QID) | ORAL | Status: DC | PRN
Start: 2017-04-29 — End: 2017-04-30

## 2017-04-29 MED ORDER — CLONIDINE HCL 0.1 MG PO TABS
0.1000 mg | ORAL_TABLET | Freq: Every day | ORAL | Status: DC
  Administered 2017-04-29 – 2017-04-30 (×2): 0.1 mg via ORAL
  Filled 2017-04-29 (×2): qty 1

## 2017-04-29 MED ORDER — STROKE: EARLY STAGES OF RECOVERY BOOK
Freq: Once | Status: AC
  Administered 2017-04-29: 04:00:00
  Filled 2017-04-29: qty 1

## 2017-04-29 MED ORDER — ASPIRIN 300 MG RE SUPP: 300.0000 mg | Freq: Every day | RECTAL | Status: DC

## 2017-04-29 MED ORDER — CYCLOSPORINE 0.05 % OP EMUL
1.0000 [drp] | Freq: Three times a day (TID) | OPHTHALMIC | Status: DC
  Administered 2017-04-29 – 2017-04-30 (×3): 1 [drp] via OPHTHALMIC
  Filled 2017-04-29 (×7): qty 1

## 2017-04-29 MED ORDER — ALBUTEROL SULFATE (2.5 MG/3ML) 0.083% IN NEBU: 2.5000 mg | INHALATION_SOLUTION | Freq: Four times a day (QID) | RESPIRATORY_TRACT | Status: DC | PRN

## 2017-04-29 MED ORDER — ASPIRIN EC 81 MG PO TBEC
81.0000 mg | DELAYED_RELEASE_TABLET | Freq: Every day | ORAL | Status: DC
  Administered 2017-04-29 – 2017-04-30 (×2): 81 mg via ORAL
  Filled 2017-04-29: qty 1

## 2017-04-29 MED ORDER — ACETAMINOPHEN 325 MG PO TABS: 650.0000 mg | ORAL_TABLET | ORAL | Status: DC | PRN

## 2017-04-29 MED ORDER — ACETAMINOPHEN 650 MG RE SUPP: 650.0000 mg | RECTAL | Status: DC | PRN

## 2017-04-29 MED ORDER — ALBUTEROL SULFATE HFA 108 (90 BASE) MCG/ACT IN AERS: 1.0000 | INHALATION_SPRAY | Freq: Four times a day (QID) | RESPIRATORY_TRACT | Status: DC | PRN

## 2017-04-29 MED ORDER — MOMETASONE FURO-FORMOTEROL FUM 200-5 MCG/ACT IN AERO
2.0000 | INHALATION_SPRAY | Freq: Two times a day (BID) | RESPIRATORY_TRACT | Status: DC
  Administered 2017-04-29 – 2017-04-30 (×3): 2 via RESPIRATORY_TRACT
  Filled 2017-04-29: qty 8.8

## 2017-04-29 MED ORDER — TIOTROPIUM BROMIDE MONOHYDRATE 18 MCG IN CAPS
18.0000 ug | ORAL_CAPSULE | Freq: Every day | RESPIRATORY_TRACT | Status: DC
  Administered 2017-04-29 – 2017-04-30 (×2): 18 ug via RESPIRATORY_TRACT
  Filled 2017-04-29: qty 5

## 2017-04-29 MED ORDER — PANTOPRAZOLE SODIUM 40 MG PO TBEC
40.0000 mg | DELAYED_RELEASE_TABLET | Freq: Every day | ORAL | Status: DC
  Administered 2017-04-29 – 2017-04-30 (×2): 40 mg via ORAL
  Filled 2017-04-29 (×2): qty 1

## 2017-04-29 MED ORDER — ASPIRIN 325 MG PO TABS
325.0000 mg | ORAL_TABLET | Freq: Every day | ORAL | Status: DC
  Administered 2017-04-29: 325 mg via ORAL
  Filled 2017-04-29: qty 1

## 2017-04-29 MED ORDER — ENOXAPARIN SODIUM 40 MG/0.4ML ~~LOC~~ SOLN
40.0000 mg | SUBCUTANEOUS | Status: DC
Start: 2017-04-29 — End: 2017-04-30
  Administered 2017-04-29 – 2017-04-30 (×2): 40 mg via SUBCUTANEOUS
  Filled 2017-04-29 (×2): qty 0.4

## 2017-04-29 MED ORDER — DORZOLAMIDE HCL 2 % OP SOLN
1.0000 [drp] | Freq: Three times a day (TID) | OPHTHALMIC | Status: DC
  Administered 2017-04-29: 1 [drp] via OPHTHALMIC
  Filled 2017-04-29: qty 10

## 2017-04-29 MED ORDER — PRAVASTATIN SODIUM 40 MG PO TABS
40.0000 mg | ORAL_TABLET | Freq: Every day | ORAL | Status: DC
  Administered 2017-04-29 – 2017-04-30 (×2): 40 mg via ORAL
  Filled 2017-04-29 (×2): qty 1

## 2017-04-29 MED ORDER — VITAMIN D (ERGOCALCIFEROL) 1.25 MG (50000 UNIT) PO CAPS: 50000.0000 [IU] | ORAL_CAPSULE | ORAL | Status: DC

## 2017-04-29 MED ORDER — OXYCODONE-ACETAMINOPHEN 10-325 MG PO TABS: 1.0000 | ORAL_TABLET | ORAL | Status: DC | PRN

## 2017-04-29 MED ORDER — IOPAMIDOL (ISOVUE-370) INJECTION 76%
INTRAVENOUS | Status: AC
  Administered 2017-04-29: 50 mL
  Filled 2017-04-29: qty 50

## 2017-04-29 MED ORDER — ACETAMINOPHEN 160 MG/5ML PO SOLN: 650.0000 mg | ORAL | Status: DC | PRN
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
## 2017-04-29 NOTE — Progress Notes (Signed)
Patient admitted from ER. Patient alert and oriented x 4. Patient oriented to room and was made comfortable. Tele placed and was verified.

## 2017-04-29 NOTE — ED Notes (Signed)
Nurse on [facility] unable to take report at this time. This nurse will give bedside report.
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
## 2017-04-29 NOTE — Progress Notes (Signed)
Patient admitted after midnight, please see H&P.  CTA, echo pending as well as evaluation by PT/OT.  Patient c/o back soreness where she fell-- will try robaxin and [place] DO

## 2017-04-29 NOTE — Progress Notes (Signed)
EEG completed, results pending. 

## 2017-04-29 NOTE — Care Management Note (Signed)
Case Management Note  Patient Details  Name: Leslie Allen MRN: [id number] Date of Birth: Oct 27, 1951  Subjective/Objective:   Pt in with syncope. She is from home with spouse.                 Action/Plan: Awaiting PT recommendations. No f/u per OT. CM following for d/c needs, physician orders.   Expected Discharge Date:                  Expected Discharge Plan:  Home/Self Care  In-House Referral:     Discharge planning Services     Post Acute Care Choice:    Choice offered to:     DME Arranged:    DME Agency:     HH Arranged:    HH Agency:     Status of Service:  In process, will continue to follow  If discussed at Long Length of Stay Meetings, dates discussed:    Additional Comments:  Pollie Friar, RN 04/29/2017, 2:17 PM

## 2017-04-29 NOTE — Procedures (Signed)
ELECTROENCEPHALOGRAM REPORT  Date of Study: 04/29/2017  Patient's Name: Leslie Allen MRN: [id number] Date of Birth: 24-Jul-1951  Referring Provider: Dr. Antony Contras  Clinical History: This is a 66 year old woman with recurrent syncope.  Medications: acetaminophen (TYLENOL) tablet 650 mg  albuterol (PROVENTIL) (2.5 MG/3ML) 0.083% nebulizer solution 2.5 mg  aspirin EC tablet 81 mg  cloNIDine (CATAPRES) tablet 0.1 mg  cycloSPORINE (RESTASIS) 0.05 % ophthalmic emulsion 1 drop  dorzolamide (TRUSOPT) 2 % ophthalmic solution 1 drop  enoxaparin (LOVENOX) injection 40 mg  methocarbamol (ROBAXIN) tablet 500 mg  mometasone-formoterol (DULERA) 200-5 MCG/ACT inhaler 2 puff  pantoprazole (PROTONIX) EC tablet 40 mg  pravastatin (PRAVACHOL) tablet 40 mg  tiotropium (SPIRIVA) inhalation capsule 18 mcg  Vitamin D (Ergocalciferol) (DRISDOL) capsule 50,000 Units   Technical Summary: A multichannel digital EEG recording measured by the international 10-20 system with electrodes applied with paste and impedances below 5000 ohms performed in our laboratory with EKG monitoring in an awake and asleep patient.  Hyperventilation was not performed. Photic stimulation was performed.  The digital EEG was referentially recorded, reformatted, and digitally filtered in a variety of bipolar and referential montages for optimal display.    Description: The patient is awake and asleep during the recording.  During maximal wakefulness, there is a symmetric, medium voltage 10 Hz posterior dominant rhythm that attenuates with eye opening.  There is occasional focal 4-5 Hz theta slowing seen over the left temporal region, at times sharply contoured without clear epileptogenic potential. During drowsiness and sleep, there is an increase in theta slowing of the background. [place]icket spikes are seen over the bilateral temporal region during drowsiness.  Vertex waves and symmetric sleep spindles were seen. Photic stimulation did not  elicit any abnormalities.  There were no clear epileptiform discharges or electrographic seizures seen.    EKG lead was unremarkable.  Impression: This awake and asleep EEG is abnormal due to occasional focal slowing over the left temporal region.  Clinical Correlation of the above findings indicates focal cerebral dysfunction over the left temporal region suggestive of underlying structural or physiologic abnormality. The absence of epileptiform discharges does not exclude a clinical diagnosis of epilepsy. Clinical correlation is advised.    Ellouise Newer, M.D.

## 2017-04-29 NOTE — ED Notes (Signed)
Attempted report x 1. Will call back in 10 minutes.
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
## 2017-04-29 NOTE — Care Management Obs Status (Signed)
[facility] NOTIFICATION   Patient Details  Name: Leslie Allen MRN: [id number] Date of Birth: 1951/07/19   Medicare Observation Status Notification Given:  Yes    Pollie Friar, RN 04/29/2017, 4:04 PM
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
## 2017-04-30 ENCOUNTER — Observation Stay (HOSPITAL_BASED_OUTPATIENT_CLINIC_OR_DEPARTMENT_OTHER): Payer: Medicare (Managed Care)

## 2017-04-30 DIAGNOSIS — G458 Other transient cerebral ischemic attacks and related syndromes: Secondary | ICD-10-CM | POA: Diagnosis not present

## 2017-04-30 DIAGNOSIS — I10 Essential (primary) hypertension: Secondary | ICD-10-CM | POA: Diagnosis not present

## 2017-04-30 DIAGNOSIS — R55 Syncope and collapse: Secondary | ICD-10-CM | POA: Diagnosis not present

## 2017-04-30 LAB — ECHOCARDIOGRAM COMPLETE
Height: 68 in
Weight: 2763.2 oz

## 2017-04-30 LAB — HEMOGLOBIN A1C
Hgb A1c MFr Bld: 5.8 % — ABNORMAL HIGH (ref 4.8–5.6)
MEAN PLASMA GLUCOSE: 120 mg/dL

## 2017-04-30 MED ORDER — ASPIRIN 81 MG PO TBEC: 81.0000 mg | DELAYED_RELEASE_TABLET | Freq: Every day | ORAL | Status: DC

## 2017-04-30 NOTE — Progress Notes (Signed)
  Echocardiogram 2D Echocardiogram has been performed.  Leslie Allen 04/30/2017, 9:44 AM
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
## 2017-04-30 NOTE — Discharge Summary (Addendum)
Physician Discharge Summary  Leslie Allen TKW:[id number] DOB: Dec 04, 1951 DOA: 04/28/2017  PCP: Arnoldo Morale, MD  Admit date: 04/28/2017 Discharge date: 04/30/2017   Recommendations for Outpatient Follow-Up:   1. Outpatient 30 day holter monitor 2. Marijuana cessation   Discharge Diagnosis:   Principal Problem:   Syncope Active Problems:   HTN (hypertension)   HLD (hyperlipidemia)   COPD with chronic bronchitis (HCC)   TIA (transient ischemic attack)   Discharge disposition:  Home  Discharge Condition: Improved.  Diet recommendation: Low sodium, heart healthy  Wound care: None.   History of Present Illness:    Leslie Allen is a 66 y.o. female with history of hypertension, hyperlipidemia, COPD and previous history of breast cancer status post mastectomy 20 years ago was brought to the ER after patient had 2 syncopal episodes at home. First episode was around 6:30 PM yesterday while patient was walking to the bathroom. Patient did not have any prodromal symptoms and suddenly collapsed and hit her head. Patient regained consciousness soon. Patient's husband decided to bring patient home and while getting into the car patient again had a brief episode. Patient did not have any incontinence of urine or tongue bite. Did not have any palpitations chest pain. But patient has been as been noticing slurred speech for last 24 hours.   ED Course: CT of the head did not show anything acute. On exam patient has left-sided ptosis with mild left facial droop. Onset is not clear. On-call neurologist Dr. Cheral Marker has been consulted. Patient admitted for further management. On arrival patient blood pressures also found to be in the low normal.   Hospital Course by Problem:   Syncopal Spells. Possible Stroke or TIA  CT no acute abnormality. Sinus disease.  MRI  NOT ORDERED As patient has metallic shrapnel in her scalp   CTA head and neck  no large vessel stenosis or occlusion.  2D Echo   Left ventricle: The cavity size was normal. Systolic function was normal. The estimated ejection fraction was in the range of 55% to 60%. Wall motion was normal; there were no regional wallmotion abnormalities.   LDL 126- on pravachol  HgbA1c 5.8  Diet Heart Room service appropriate? Yes; Fluid consistency: Thin  No antithrombotic prior to admission, now on aspirin 81 mg daily  Hypertension  Stable  Hyperlipidemia  Home meds:  pravachol 40, resumed in hospital  LDL 126 goal  Continue statin at discharge   Medical Consultants:    Neuro   Discharge Exam:   Vitals:   04/30/17 1024 04/30/17 1411  BP: (!) 123/106 (!) 144/76  Pulse: 100 70  Resp: 18 18  Temp: 98.1 F (36.7 C) 98 F (36.7 C)   Vitals:   04/30/17 0516 04/30/17 0750 04/30/17 1024 04/30/17 1411  BP: 127/80  (!) 123/106 (!) 144/76  Pulse: 63  100 70  Resp: 18  18 18   Temp: 97.7 F (36.5 C)  98.1 F (36.7 C) 98 F (36.7 C)  TempSrc: Oral  Oral Oral  SpO2: 100% 98% 100% 100%  Weight:      Height:        Gen:  NAD    The results of significant diagnostics from this hospitalization (including imaging, microbiology, ancillary and laboratory) are listed below for reference.     Procedures and Diagnostic Studies:   Ct Angio Head W Or Wo Contrast  Result Date: 04/29/2017 CLINICAL DATA:  66 y/o  F; TIA. EXAM: CT ANGIOGRAPHY HEAD AND NECK TECHNIQUE: Multidetector  CT imaging of the head and neck was performed using the standard protocol during bolus administration of intravenous contrast. Multiplanar CT image reconstructions and MIPs were obtained to evaluate the vascular anatomy. Carotid stenosis measurements (when applicable) are obtained utilizing NASCET criteria, using the distal internal carotid diameter as the denominator. CONTRAST:  50 cc Isovue 370 COMPARISON:  04/28/2017 CT of the head. FINDINGS: CTA NECK FINDINGS Aortic arch: Bovine arch, normal variant. Imaged portion shows no evidence  of aneurysm or dissection. No significant stenosis of the major arch vessel origins. Right carotid system: No evidence of dissection, stenosis (50% or greater) or occlusion. Minimal non stenotic calcific atherosclerosis of carotid bifurcation. Left carotid system: No evidence of dissection, stenosis (50% or greater) or occlusion. Mild non stenotic calcific atherosclerosis of carotid bifurcation. Vertebral arteries: Left dominant. No evidence of dissection, stenosis (50% or greater) or occlusion. Skeleton: Mild cervical spondylosis. No high-grade bony canal stenosis or foraminal narrowing. Other neck: Multinodular thyroid gland the largest nodule in the right lobe of thyroid extending inferiorly below the head of clavicle measuring up to 4.1 cm. Upper chest: Negative. Review of the MIP images confirms the above findings CTA HEAD FINDINGS Anterior circulation: No significant stenosis, proximal occlusion, aneurysm, or vascular malformation. Mild non stenotic calcification of cavernous and paraclinoid segments. Posterior circulation: No significant stenosis, proximal occlusion, aneurysm, or vascular malformation. Venous sinuses: As permitted by contrast timing, patent. Anatomic variants: No anterior communicating artery identified, likely hypoplastic or absent. Large posterior communicating arteries with small P1 segments compatible with persistent fetal circulation. Delayed phase: No abnormal intracranial enhancement. Review of the MIP images confirms the above findings IMPRESSION: 1. Patent carotid and vertebral arteries. No large vessel occlusion, aneurysm, or significant stenosis is identified. 2. Patent circle of Willis. No large vessel occlusion, aneurysm, or significant stenosis is identified. 3. Right inferior lobe of thyroid nodule measuring up to 4.1 cm. Further characterization with thyroid ultrasound is recommended. Electronically Signed   By: Kristine Garbe M.D.   On: 04/29/2017 13:42   Ct Head  Wo Contrast  Result Date: 04/28/2017 CLINICAL DATA:  Slurred speech. Generalized weakness. History of breast cancer. EXAM: CT HEAD WITHOUT CONTRAST TECHNIQUE: Contiguous axial images were obtained from the base of the skull through the vertex without intravenous contrast. COMPARISON:  None. FINDINGS: Brain: No evidence of parenchymal hemorrhage or extra-axial fluid collection. No mass lesion, mass effect, or midline shift. No CT evidence of acute infarction. Cerebral volume is age appropriate. No ventriculomegaly. Vascular: No hyperdense vessel or unexpected calcification. Skull: No evidence of calvarial fracture. Sinuses/Orbits: Extensive mucoperiosteal thickening in the visualized left maxillary sinus with asymmetric hyperostosis throughout the left maxillary sinus walls. Mild mucoperiosteal thickening in the bilateral ethmoidal air cells with partial opacification of the left ethmoidal air cells. No fluid levels in the visualized paranasal sinuses. Other: There are a few clustered punctate metallic densities within the deep left occipital soft tissues (series 3/ image 6), which may represent tiny radiopaque foreign bodies. The mastoid air cells are unopacified. IMPRESSION: 1.  No evidence of acute intracranial abnormality. 2. Prominent chronic paranasal sinusitis, most prominently involving the left maxillary sinus. Electronically Signed   By: Ilona Sorrel M.D.   On: 04/28/2017 20:57   Ct Angio Neck W Or Wo Contrast  Result Date: 04/29/2017 CLINICAL DATA:  66 y/o  F; TIA. EXAM: CT ANGIOGRAPHY HEAD AND NECK TECHNIQUE: Multidetector CT imaging of the head and neck was performed using the standard protocol during bolus administration of intravenous contrast. Multiplanar  CT image reconstructions and MIPs were obtained to evaluate the vascular anatomy. Carotid stenosis measurements (when applicable) are obtained utilizing NASCET criteria, using the distal internal carotid diameter as the denominator. CONTRAST:   50 cc Isovue 370 COMPARISON:  04/28/2017 CT of the head. FINDINGS: CTA NECK FINDINGS Aortic arch: Bovine arch, normal variant. Imaged portion shows no evidence of aneurysm or dissection. No significant stenosis of the major arch vessel origins. Right carotid system: No evidence of dissection, stenosis (50% or greater) or occlusion. Minimal non stenotic calcific atherosclerosis of carotid bifurcation. Left carotid system: No evidence of dissection, stenosis (50% or greater) or occlusion. Mild non stenotic calcific atherosclerosis of carotid bifurcation. Vertebral arteries: Left dominant. No evidence of dissection, stenosis (50% or greater) or occlusion. Skeleton: Mild cervical spondylosis. No high-grade bony canal stenosis or foraminal narrowing. Other neck: Multinodular thyroid gland the largest nodule in the right lobe of thyroid extending inferiorly below the head of clavicle measuring up to 4.1 cm. Upper chest: Negative. Review of the MIP images confirms the above findings CTA HEAD FINDINGS Anterior circulation: No significant stenosis, proximal occlusion, aneurysm, or vascular malformation. Mild non stenotic calcification of cavernous and paraclinoid segments. Posterior circulation: No significant stenosis, proximal occlusion, aneurysm, or vascular malformation. Venous sinuses: As permitted by contrast timing, patent. Anatomic variants: No anterior communicating artery identified, likely hypoplastic or absent. Large posterior communicating arteries with small P1 segments compatible with persistent fetal circulation. Delayed phase: No abnormal intracranial enhancement. Review of the MIP images confirms the above findings IMPRESSION: 1. Patent carotid and vertebral arteries. No large vessel occlusion, aneurysm, or significant stenosis is identified. 2. Patent circle of Willis. No large vessel occlusion, aneurysm, or significant stenosis is identified. 3. Right inferior lobe of thyroid nodule measuring up to 4.1  cm. Further characterization with thyroid ultrasound is recommended. Electronically Signed   By: Kristine Garbe M.D.   On: 04/29/2017 13:42   Dg Chest Port 1 View  Result Date: 04/29/2017 CLINICAL DATA:  Stroke. EXAM: PORTABLE CHEST 1 VIEW COMPARISON:  12/19/2016. FINDINGS: Mediastinum and hilar structures normal. Lungs are clear. Cardiomegaly with normal pulmonary vascularity. No pleural effusion or pneumothorax. IMPRESSION: 1. Cardiomegaly.  No evidence CHF. 2. No acute pulmonary disease. Electronically Signed   By: Marcello Moores  Register   On: 04/29/2017 07:38     Labs:   Basic Metabolic Panel:  Recent Labs Lab 04/28/17 1845 04/28/17 1922 04/29/17 0507  NA 139 140  --   K 3.7 3.7  --   CL 107 106  --   CO2 25  --   --   GLUCOSE 92 88  --   BUN 13 15  --   CREATININE 0.96 1.00 0.83  CALCIUM 9.3  --   --    GFR Estimated Creatinine Clearance: 72.4 mL/min (by C-G formula based on SCr of 0.83 mg/dL). Liver Function Tests: No results for input(s): AST, ALT, ALKPHOS, BILITOT, PROT, ALBUMIN in the last 168 hours. No results for input(s): LIPASE, AMYLASE in the last 168 hours. No results for input(s): AMMONIA in the last 168 hours. Coagulation profile  Recent Labs Lab 04/28/17 1855  INR 0.97    CBC:  Recent Labs Lab 04/28/17 1845 04/28/17 1922 04/29/17 0507  WBC 6.7  --  9.0  NEUTROABS 3.2  --   --   HGB 13.5 14.3 13.2  HCT 41.6 42.0 40.3  MCV 80.9  --  80.3  PLT 225  --  229   Cardiac Enzymes: No results for input(s):  CKTOTAL, CKMB, CKMBINDEX, TROPONINI in the last 168 hours. BNP: Invalid input(s): POCBNP CBG:  Recent Labs Lab 04/28/17 1825 04/28/17 1845  GLUCAP 95 89   D-Dimer No results for input(s): DDIMER in the last 72 hours. Hgb A1c  Recent Labs  04/29/17 0507  HGBA1C 5.8*   Lipid Profile  Recent Labs  04/29/17 0507  CHOL 204*  HDL 52  LDLCALC 126*  TRIG 128  CHOLHDL 3.9   Thyroid function studies No results for input(s):  TSH, T4TOTAL, T3FREE, THYROIDAB in the last 72 hours.  Invalid input(s): FREET3 Anemia work up No results for input(s): VITAMINB12, FOLATE, FERRITIN, TIBC, IRON, RETICCTPCT in the last 72 hours. Microbiology No results found for this or any previous visit (from the past 240 hour(s)).   Discharge Instructions:   Discharge Instructions    Ambulatory referral to Neurology    Complete by:  As directed    An appointment is requested in approximately: 1 month   Diet - low sodium heart healthy    Complete by:  As directed    Discharge instructions    Complete by:  As directed    marijuana cessation   Increase activity slowly    Complete by:  As directed      Allergies as of 04/30/2017   No Known Allergies     Medication List    STOP taking these medications   atorvastatin 20 MG tablet Commonly known as:  LIPITOR   benzonatate 100 MG capsule Commonly known as:  TESSALON   calcium gluconate 500 MG tablet     TAKE these medications   albuterol (2.5 MG/3ML) 0.083% nebulizer solution Commonly known as:  PROVENTIL Take 2.5 mg by nebulization every 6 (six) hours as needed for wheezing or shortness of breath.   albuterol 108 (90 Base) MCG/ACT inhaler Commonly known as:  PROVENTIL HFA;VENTOLIN HFA Inhale 1-2 puffs into the lungs every 6 (six) hours as needed for wheezing or shortness of breath.   amLODipine 5 MG tablet Commonly known as:  NORVASC Take 1 tablet (5 mg total) by mouth daily.   aspirin 81 MG EC tablet Take 1 tablet (81 mg total) by mouth daily. Start taking on:  05/01/2017   brimonidine 0.2 % ophthalmic solution Commonly known as:  ALPHAGAN Place 1 drop into the right eye 3 (three) times daily. What changed:  Another medication with the same name was removed. Continue taking this medication, and follow the directions you see here.   cetirizine 10 MG tablet Commonly known as:  ZYRTEC Take 1 tablet (10 mg total) by mouth daily. What changed:  when to take  this  reasons to take this   cloNIDine 0.1 MG tablet Commonly known as:  CATAPRES Take 1 tablet (0.1 mg total) by mouth at bedtime.   dexlansoprazole 60 MG capsule Commonly known as:  DEXILANT Take 60 mg by mouth daily.   Fluticasone-Salmeterol 250-50 MCG/DOSE Aepb Commonly known as:  ADVAIR Inhale 1 puff into the lungs 2 (two) times daily.   meloxicam 7.5 MG tablet Commonly known as:  MOBIC Take 1 tablet (7.5 mg total) by mouth 2 (two) times daily as needed for pain.   oxyCODONE-acetaminophen 10-325 MG tablet Commonly known as:  PERCOCET Take 1-2 tablets by mouth every 4 (four) hours as needed for pain.   pravastatin 40 MG tablet Commonly known as:  PRAVACHOL Take 40 mg by mouth daily.   PRESCRIPTION MEDICATION Inhale into the lungs at bedtime. CPAP   SIMBRINZA 1-0.2 %  Susp Generic drug:  Brinzolamide-Brimonidine Place 1 drop into the left eye 3 (three) times daily.   tiotropium 18 MCG inhalation capsule Commonly known as:  SPIRIVA Place 18 mcg into inhaler and inhale daily.   Vitamin D (Ergocalciferol) 50000 units Caps capsule Commonly known as:  DRISDOL Take 50,000 Units by mouth every Wednesday. Sunday      Follow-up Information    Arnoldo Morale, MD Follow up in 1 week(s).   Specialty:  Family Medicine Contact information: [address] [place] [address] [phone]            Time coordinating discharge: 25 min  Signed:  [place]   [facility] 04/30/2017, 2:57 PM
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
## 2017-04-30 NOTE — Progress Notes (Addendum)
Occupational Therapy Evaluation Addendum:    2017/05/20 1400  OT G-codes **NOT FOR INPATIENT CLASS**  Functional Limitation Self care  Self Care Current Status (E4353) CI  Self Care Goal Status (P1225) CI  Self Care Discharge Status [phone]) CI  Lucille Passy, OTR/L [phone]

## 2017-04-30 NOTE — Discharge Instructions (Signed)
Follow with Arnoldo Morale, MD in 5-7 days  Please get a complete blood count and chemistry panel checked by your Primary MD at your next visit, and again as instructed by your Primary MD. Please get your medications reviewed and adjusted by your Primary MD.  Please request your Primary MD to go over all Hospital Tests and Procedure/Radiological results at the follow up, please get all Hospital records sent to your Prim MD by signing hospital release before you go home.  If you had Pneumonia of Lung problems at the Hospital: Please get a 2 view Chest X ray done in 6-8 weeks after hospital discharge or sooner if instructed by your Primary MD.  If you have Congestive Heart Failure: Please call your Cardiologist or Primary MD anytime you have any of the following symptoms:  1) 3 pound weight gain in 24 hours or 5 pounds in 1 week  2) shortness of breath, with or without a dry hacking cough  3) swelling in the hands, feet or stomach  4) if you have to sleep on extra pillows at night in order to breathe  Follow cardiac low salt diet and 1.5 lit/day fluid restriction.  If you have diabetes Accuchecks 4 times/day, Once in AM empty stomach and then before each meal. Log in all results and show them to your primary doctor at your next visit. If any glucose reading is under 80 or above 300 call your primary MD immediately.  If you have Seizure/Convulsions/Epilepsy: Please do not drive, operate heavy machinery, participate in activities at heights or participate in high speed sports until you have seen by Primary MD or a Neurologist and advised to do so again.  If you had Gastrointestinal Bleeding: Please ask your Primary MD to check a complete blood count within one week of discharge or at your next visit. Your endoscopic/colonoscopic biopsies that are pending at the time of discharge, will also need to followed by your Primary MD.  Get Medicines reviewed and adjusted. Please take all your  medications with you for your next visit with your Primary MD  Please request your Primary MD to go over all hospital tests and procedure/radiological results at the follow up, please ask your Primary MD to get all Hospital records sent to his/her office.  If you experience worsening of your admission symptoms, develop shortness of breath, life threatening emergency, suicidal or homicidal thoughts you must seek medical attention immediately by calling 911 or calling your MD immediately  if symptoms less severe.  You must read complete instructions/literature along with all the possible adverse reactions/side effects for all the Medicines you take and that have been prescribed to you. Take any new Medicines after you have completely understood and accpet all the possible adverse reactions/side effects.   Do not drive or operate heavy machinery when taking Pain medications.   Do not take more than prescribed Pain, Sleep and Anxiety Medications  Special Instructions: If you have smoked or chewed Tobacco  in the last 2 yrs please stop smoking, stop any regular Alcohol  and or any Recreational drug use.  Wear Seat belts while driving.  Please note You were cared for by a hospitalist during your hospital stay. If you have any questions about your discharge medications or the care you received while you were in the hospital after you are discharged, you can call the unit and asked to speak with the hospitalist on call if the hospitalist that took care of you is not available. Once  you are discharged, your primary care physician will handle any further medical issues. Please note that NO REFILLS for any discharge medications will be authorized once you are discharged, as it is imperative that you return to your primary care physician (or establish a relationship with a primary care physician if you do not have one) for your aftercare needs so that they can reassess your need for medications and monitor your  lab values.  You can reach the hospitalist office at phone [phone] or fax [phone]   If you do not have a primary care physician, you can call [phone] for a physician referral.  Activity: As tolerated with Full fall precautions use walker/cane & assistance as needed

## 2017-05-02 ENCOUNTER — Other Ambulatory Visit: Payer: Self-pay | Admitting: Physician Assistant

## 2017-05-02 DIAGNOSIS — R55 Syncope and collapse: Secondary | ICD-10-CM

## 2017-05-09 ENCOUNTER — Ambulatory Visit: Payer: Medicaid Other | Attending: Family Medicine | Admitting: Family Medicine

## 2017-05-09 ENCOUNTER — Encounter: Payer: Self-pay | Admitting: Family Medicine

## 2017-05-09 VITALS — BP 146/85 | HR 67 | Temp 97.3°F | Resp 18 | Ht 68.0 in | Wt 174.0 lb

## 2017-05-09 DIAGNOSIS — Z7982 Long term (current) use of aspirin: Secondary | ICD-10-CM | POA: Diagnosis not present

## 2017-05-09 DIAGNOSIS — E041 Nontoxic single thyroid nodule: Secondary | ICD-10-CM

## 2017-05-09 DIAGNOSIS — E785 Hyperlipidemia, unspecified: Secondary | ICD-10-CM | POA: Insufficient documentation

## 2017-05-09 DIAGNOSIS — R55 Syncope and collapse: Secondary | ICD-10-CM | POA: Insufficient documentation

## 2017-05-09 DIAGNOSIS — Z79899 Other long term (current) drug therapy: Secondary | ICD-10-CM | POA: Insufficient documentation

## 2017-05-09 DIAGNOSIS — F1290 Cannabis use, unspecified, uncomplicated: Secondary | ICD-10-CM | POA: Insufficient documentation

## 2017-05-09 DIAGNOSIS — M5136 Other intervertebral disc degeneration, lumbar region: Secondary | ICD-10-CM | POA: Insufficient documentation

## 2017-05-09 DIAGNOSIS — Z853 Personal history of malignant neoplasm of breast: Secondary | ICD-10-CM | POA: Diagnosis not present

## 2017-05-09 DIAGNOSIS — Z8711 Personal history of peptic ulcer disease: Secondary | ICD-10-CM | POA: Diagnosis not present

## 2017-05-09 DIAGNOSIS — I10 Essential (primary) hypertension: Secondary | ICD-10-CM | POA: Insufficient documentation

## 2017-05-09 DIAGNOSIS — J449 Chronic obstructive pulmonary disease, unspecified: Secondary | ICD-10-CM | POA: Diagnosis not present

## 2017-05-09 DIAGNOSIS — Z9011 Acquired absence of right breast and nipple: Secondary | ICD-10-CM | POA: Diagnosis not present

## 2017-05-09 DIAGNOSIS — H409 Unspecified glaucoma: Secondary | ICD-10-CM | POA: Insufficient documentation

## 2017-05-09 MED ORDER — TIZANIDINE HCL 4 MG PO TABS: 4.0000 mg | ORAL_TABLET | Freq: Three times a day (TID) | ORAL | 1 refills | Status: DC | PRN
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
## 2017-05-09 NOTE — Progress Notes (Signed)
Patient is here for [id number]  Patient complains of back pain increasing after syncope episode.  Patient has taken medication today. Patient has eaten today.

## 2017-05-14 ENCOUNTER — Other Ambulatory Visit: Payer: Self-pay | Admitting: Physician Assistant

## 2017-05-14 ENCOUNTER — Telehealth: Payer: Self-pay | Admitting: Internal Medicine

## 2017-05-14 ENCOUNTER — Ambulatory Visit (INDEPENDENT_AMBULATORY_CARE_PROVIDER_SITE_OTHER): Payer: Medicare Other

## 2017-05-14 DIAGNOSIS — G458 Other transient cerebral ischemic attacks and related syndromes: Secondary | ICD-10-CM | POA: Diagnosis not present

## 2017-05-14 DIAGNOSIS — R55 Syncope and collapse: Secondary | ICD-10-CM

## 2017-05-14 NOTE — Telephone Encounter (Signed)
[facility] cardiac event monitoring service called the cardiology after hours line reporting patient selected fainting on her event monitor.  The ECG at the time of the event only showed normal sinus rhythm per [facility].  The [facility] service was unable to contact patient and will continue to try and contact patient to confirm symptoms.  At this time, her heart rated continues to be normal sinus rhythm without any arrhythmia documented.  Liborio Nixon, MD

## 2017-05-15 ENCOUNTER — Telehealth: Payer: Self-pay | Admitting: Nurse Practitioner

## 2017-05-15 ENCOUNTER — Ambulatory Visit (HOSPITAL_COMMUNITY)
Admission: RE | Admit: 2017-05-15 | Discharge: 2017-05-15 | Disposition: A | Discharge status: Home / Self Care | Payer: Medicare Other | Source: Ambulatory Visit | Attending: Family Medicine | Admitting: Family Medicine

## 2017-05-15 DIAGNOSIS — E041 Nontoxic single thyroid nodule: Secondary | ICD-10-CM | POA: Insufficient documentation

## 2017-05-15 NOTE — Telephone Encounter (Signed)
Called patient in response to monitor report that was received. Patient states she felt light-headed last night at approximately 9:30 pm and pressed the button on her monitor. Monitor company notified our on-call provider last night and monitor reveals NSR with artifact. I advised the patient we will continue to monitor. She thanked me for the call.

## 2017-05-19 ENCOUNTER — Other Ambulatory Visit: Payer: Self-pay | Admitting: Family Medicine

## 2017-05-19 DIAGNOSIS — E041 Nontoxic single thyroid nodule: Secondary | ICD-10-CM

## 2017-05-27 ENCOUNTER — Telehealth: Payer: Self-pay | Admitting: Family Medicine

## 2017-05-27 NOTE — Telephone Encounter (Signed)
Patient is calling back regarding referral to endo. She can be reached on her mobile at any time.

## 2017-05-29 DIAGNOSIS — R4781 Slurred speech: Secondary | ICD-10-CM

## 2017-06-09 ENCOUNTER — Encounter: Payer: Self-pay | Admitting: Family Medicine

## 2017-06-09 ENCOUNTER — Other Ambulatory Visit: Payer: Self-pay | Admitting: Family Medicine

## 2017-06-09 ENCOUNTER — Ambulatory Visit: Payer: Medicare Other | Attending: Family Medicine | Admitting: Family Medicine

## 2017-06-09 VITALS — BP 125/76 | HR 71 | Temp 97.9°F | Resp 18 | Ht 68.0 in | Wt 172.0 lb

## 2017-06-09 DIAGNOSIS — J449 Chronic obstructive pulmonary disease, unspecified: Secondary | ICD-10-CM | POA: Insufficient documentation

## 2017-06-09 DIAGNOSIS — F1290 Cannabis use, unspecified, uncomplicated: Secondary | ICD-10-CM | POA: Diagnosis not present

## 2017-06-09 DIAGNOSIS — Z9011 Acquired absence of right breast and nipple: Secondary | ICD-10-CM | POA: Diagnosis not present

## 2017-06-09 DIAGNOSIS — E041 Nontoxic single thyroid nodule: Secondary | ICD-10-CM | POA: Diagnosis not present

## 2017-06-09 DIAGNOSIS — Z7982 Long term (current) use of aspirin: Secondary | ICD-10-CM | POA: Diagnosis not present

## 2017-06-09 DIAGNOSIS — K5900 Constipation, unspecified: Secondary | ICD-10-CM | POA: Insufficient documentation

## 2017-06-09 DIAGNOSIS — K641 Second degree hemorrhoids: Secondary | ICD-10-CM

## 2017-06-09 DIAGNOSIS — H402234 Chronic angle-closure glaucoma, bilateral, indeterminate stage: Secondary | ICD-10-CM | POA: Diagnosis not present

## 2017-06-09 DIAGNOSIS — M5136 Other intervertebral disc degeneration, lumbar region: Secondary | ICD-10-CM | POA: Diagnosis not present

## 2017-06-09 DIAGNOSIS — Z853 Personal history of malignant neoplasm of breast: Secondary | ICD-10-CM | POA: Diagnosis not present

## 2017-06-09 DIAGNOSIS — I10 Essential (primary) hypertension: Secondary | ICD-10-CM | POA: Insufficient documentation

## 2017-06-09 DIAGNOSIS — K649 Unspecified hemorrhoids: Secondary | ICD-10-CM | POA: Diagnosis not present

## 2017-06-09 DIAGNOSIS — Z8711 Personal history of peptic ulcer disease: Secondary | ICD-10-CM | POA: Diagnosis not present

## 2017-06-09 DIAGNOSIS — E785 Hyperlipidemia, unspecified: Secondary | ICD-10-CM | POA: Diagnosis not present

## 2017-06-09 MED ORDER — HYDROCORTISONE ACE-PRAMOXINE 1-1 % RE CREA: 1.0000 "application " | TOPICAL_CREAM | Freq: Two times a day (BID) | RECTAL | 2 refills | Status: DC

## 2017-06-09 MED ORDER — VITAMIN D (ERGOCALCIFEROL) 1.25 MG (50000 UNIT) PO CAPS: 50000.0000 [IU] | ORAL_CAPSULE | ORAL | 2 refills | Status: DC

## 2017-06-09 NOTE — Patient Instructions (Signed)
Hemorrhoids Hemorrhoids are swollen veins in and around the rectum or anus. There are two types of hemorrhoids:  Internal hemorrhoids. These occur in the veins that are just inside the rectum. They may poke through to the outside and become irritated and painful.  External hemorrhoids. These occur in the veins that are outside of the anus and can be felt as a painful swelling or hard lump near the anus.  Most hemorrhoids do not cause serious problems, and they can be managed with home treatments such as diet and lifestyle changes. If home treatments do not help your symptoms, procedures can be done to shrink or remove the hemorrhoids. What are the causes? This condition is caused by increased pressure in the anal area. This pressure may result from various things, including:  Constipation.  Straining to have a bowel movement.  Diarrhea.  Pregnancy.  Obesity.  Sitting for long periods of time.  Heavy lifting or other activity that causes you to strain.  Anal sex.  What are the signs or symptoms? Symptoms of this condition include:  Pain.  Anal itching or irritation.  Rectal bleeding.  Leakage of stool (feces).  Anal swelling.  One or more lumps around the anus.  How is this diagnosed? This condition can often be diagnosed through a visual exam. Other exams or tests may also be done, such as:  Examination of the rectal area with a gloved hand (digital rectal exam).  Examination of the anal canal using a small tube (anoscope).  A blood test, if you have lost a significant amount of blood.  A test to look inside the colon (sigmoidoscopy or colonoscopy).  How is this treated? This condition can usually be treated at home. However, various procedures may be done if dietary changes, lifestyle changes, and other home treatments do not help your symptoms. These procedures can help make the hemorrhoids smaller or remove them completely. Some of these procedures involve  surgery, and others do not. Common procedures include:  Rubber band ligation. Rubber bands are placed at the base of the hemorrhoids to cut off the blood supply to them.  Sclerotherapy. Medicine is injected into the hemorrhoids to shrink them.  Infrared coagulation. A type of light energy is used to get rid of the hemorrhoids.  Hemorrhoidectomy surgery. The hemorrhoids are surgically removed, and the veins that supply them are tied off.  Stapled hemorrhoidopexy surgery. A circular stapling device is used to remove the hemorrhoids and use staples to cut off the blood supply to them.  Follow these instructions at home: Eating and drinking  Eat foods that have a lot of fiber in them, such as whole grains, beans, nuts, fruits, and vegetables. Ask your health care provider about taking products that have added fiber (fiber supplements).  Drink enough fluid to keep your urine clear or pale yellow. Managing pain and swelling  Take warm sitz baths for 20 minutes, 3-4 times a day to ease pain and discomfort.  If directed, apply ice to the affected area. Using ice packs between sitz baths may be helpful. ? Put ice in a plastic bag. ? Place a towel between your skin and the bag. ? Leave the ice on for 20 minutes, 2-3 times a day. General instructions  Take over-the-counter and prescription medicines only as told by your health care provider.  Use medicated creams or suppositories as told.  Exercise regularly.  Go to the bathroom when you have the urge to have a bowel movement. Do not wait.    Avoid straining to have bowel movements.  Keep the anal area dry and clean. Use wet toilet paper or moist towelettes after a bowel movement.  Do not sit on the toilet for long periods of time. This increases blood pooling and pain. Contact a health care provider if:  You have increasing pain and swelling that are not controlled by treatment or medicine.  You have uncontrolled bleeding.  You  have difficulty having a bowel movement, or you are unable to have a bowel movement.  You have pain or inflammation outside the area of the hemorrhoids. This information is not intended to replace advice given to you by your health care provider. Make sure you discuss any questions you have with your health care provider. Document Released: 12/13/2000 Document Revised: 05/15/2016 Document Reviewed: 08/30/2015 Elsevier Interactive Patient Education  2017 [employer]  

## 2017-06-09 NOTE — Progress Notes (Signed)
Patient is here for Hemorrhoid concern.  Patient complains of hemorrhoids being present for the past few days.  Patient has not taken medication today. Patient has eaten today.
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
## 2017-06-11 ENCOUNTER — Encounter: Payer: Self-pay | Admitting: Family Medicine

## 2017-06-11 DIAGNOSIS — M5136 Other intervertebral disc degeneration, lumbar region: Secondary | ICD-10-CM | POA: Diagnosis not present

## 2017-06-12 ENCOUNTER — Telehealth: Payer: Self-pay | Admitting: *Deleted

## 2017-06-12 NOTE — Telephone Encounter (Signed)
-----   Message from Arnoldo Morale, MD sent at 05/19/2017  4:12 PM EDT ----- Thyroid ultrasound reveals thyroid nodule and I have referred her to endocrinology for further management.

## 2017-06-12 NOTE — Telephone Encounter (Signed)
Patient verified DOB Patient is aware of thyroid nodule being noted and being referred to [facility]. Patient was provided all of the contact information for referral center. Patient wrote down the information and had no further questions.

## 2017-06-17 DIAGNOSIS — M47816 Spondylosis without myelopathy or radiculopathy, lumbar region: Secondary | ICD-10-CM | POA: Diagnosis not present

## 2017-06-17 DIAGNOSIS — M25562 Pain in left knee: Secondary | ICD-10-CM | POA: Diagnosis not present

## 2017-06-17 DIAGNOSIS — M25561 Pain in right knee: Secondary | ICD-10-CM | POA: Diagnosis not present

## 2017-06-17 DIAGNOSIS — M5136 Other intervertebral disc degeneration, lumbar region: Secondary | ICD-10-CM | POA: Diagnosis not present

## 2017-07-01 ENCOUNTER — Ambulatory Visit (INDEPENDENT_AMBULATORY_CARE_PROVIDER_SITE_OTHER): Payer: Medicare Other | Admitting: Neurology

## 2017-07-01 ENCOUNTER — Encounter: Payer: Self-pay | Admitting: Neurology

## 2017-07-01 VITALS — BP 137/92 | HR 67 | Wt 173.8 lb

## 2017-07-01 DIAGNOSIS — R55 Syncope and collapse: Secondary | ICD-10-CM | POA: Diagnosis not present
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
## 2017-07-01 NOTE — Patient Instructions (Signed)
I had a long discussion with the patient with regards to her episode of unexplained syncope as well as abnormal but not epileptiform EEG findings and answered questions. She has had a negative neurovascular workup. Recommend repeat EEG study. Continue aspirin for stroke prevention and strict control of hypertension with blood pressure goal below 130/90 and lipids with LDL cholesterol goal below 70 mg percent. She was advised not to drive for a period of 6 months since her episode as per [facility]. She will return for follow-up in the future as necessary.

## 2017-07-03 DIAGNOSIS — Z961 Presence of intraocular lens: Secondary | ICD-10-CM | POA: Diagnosis not present

## 2017-07-03 DIAGNOSIS — H401133 Primary open-angle glaucoma, bilateral, severe stage: Secondary | ICD-10-CM | POA: Diagnosis not present

## 2017-07-04 ENCOUNTER — Telehealth: Payer: Self-pay | Admitting: Family Medicine

## 2017-07-04 DIAGNOSIS — C50911 Malignant neoplasm of unspecified site of right female breast: Secondary | ICD-10-CM | POA: Diagnosis not present

## 2017-07-04 NOTE — Telephone Encounter (Signed)
PATIENT lost written prescription for bra's and needs one to be faxed to [phone] [facility]

## 2017-07-07 ENCOUNTER — Ambulatory Visit (INDEPENDENT_AMBULATORY_CARE_PROVIDER_SITE_OTHER): Payer: Medicare Other | Admitting: Endocrinology

## 2017-07-07 ENCOUNTER — Encounter: Payer: Self-pay | Admitting: Endocrinology

## 2017-07-07 VITALS — BP 126/74 | HR 78 | Ht 68.0 in | Wt 176.8 lb

## 2017-07-07 DIAGNOSIS — E041 Nontoxic single thyroid nodule: Secondary | ICD-10-CM

## 2017-07-07 LAB — T3, FREE: T3, Free: 3.4 pg/mL (ref 2.3–4.2)

## 2017-07-07 LAB — T4, FREE: FREE T4: 0.9 ng/dL (ref 0.60–1.60)

## 2017-07-07 LAB — TSH: TSH: 0.41 u[IU]/mL (ref 0.35–4.50)

## 2017-07-07 NOTE — Addendum Note (Signed)
Addended by: Elayne Snare on: 07/07/2017 09:49 PM   Modules accepted: Orders

## 2017-07-07 NOTE — Progress Notes (Signed)
Please call to let patient know that the thyroid test is normal and we will order biopsy
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
## 2017-07-10 ENCOUNTER — Ambulatory Visit (INDEPENDENT_AMBULATORY_CARE_PROVIDER_SITE_OTHER): Payer: Medicare Other

## 2017-07-10 DIAGNOSIS — R55 Syncope and collapse: Secondary | ICD-10-CM

## 2017-07-10 NOTE — Telephone Encounter (Signed)
Rx written [facility] to [facility]

## 2017-07-11 ENCOUNTER — Ambulatory Visit: Payer: Medicare Other | Attending: Family Medicine | Admitting: Physician Assistant

## 2017-07-11 VITALS — BP 119/83 | HR 72 | Temp 97.8°F | Resp 18 | Ht 68.0 in | Wt 176.6 lb

## 2017-07-11 DIAGNOSIS — Z7982 Long term (current) use of aspirin: Secondary | ICD-10-CM | POA: Insufficient documentation

## 2017-07-11 DIAGNOSIS — Z79899 Other long term (current) drug therapy: Secondary | ICD-10-CM | POA: Diagnosis not present

## 2017-07-11 DIAGNOSIS — I10 Essential (primary) hypertension: Secondary | ICD-10-CM | POA: Diagnosis not present

## 2017-07-11 DIAGNOSIS — J40 Bronchitis, not specified as acute or chronic: Secondary | ICD-10-CM | POA: Diagnosis not present

## 2017-07-11 DIAGNOSIS — I639 Cerebral infarction, unspecified: Secondary | ICD-10-CM

## 2017-07-11 DIAGNOSIS — J449 Chronic obstructive pulmonary disease, unspecified: Secondary | ICD-10-CM | POA: Insufficient documentation

## 2017-07-11 MED ORDER — AZITHROMYCIN 250 MG PO TABS
ORAL_TABLET | ORAL | 0 refills | Status: DC
Start: 2017-07-11 — End: 2017-09-11

## 2017-07-11 MED ORDER — PROMETHAZINE HCL 12.5 MG PO TABS: ORAL_TABLET | ORAL | 0 refills | Status: DC
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
## 2017-07-11 NOTE — Progress Notes (Signed)
Patient has not eaten  Patient has only taken her BP medication  Patient wants a new nebulizer  Patient voice comes in and out and her back is in pain

## 2017-07-14 ENCOUNTER — Telehealth: Payer: Self-pay | Admitting: Family Medicine

## 2017-07-14 NOTE — Telephone Encounter (Signed)
Pt calling to get a refill for dexlansoprazole (DEXILANT) 60 MG capsule  This med was prescribe by a provider in [place], she spoke with you to see if you can order a prescription for her now, please follow up

## 2017-07-15 ENCOUNTER — Telehealth: Payer: Self-pay

## 2017-07-15 NOTE — Telephone Encounter (Signed)
LEFT vm for patient to call back during business orders to get EEG results.

## 2017-07-15 NOTE — Telephone Encounter (Signed)
Paperwork has been faxed over.

## 2017-07-15 NOTE — Telephone Encounter (Signed)
-----   Message from Garvin Fila, MD sent at 07/15/2017  5:02 PM EDT ----- Mitchell Heir inform the patient had EEG study was normal

## 2017-07-16 DIAGNOSIS — Z853 Personal history of malignant neoplasm of breast: Secondary | ICD-10-CM | POA: Diagnosis not present

## 2017-07-16 NOTE — Telephone Encounter (Signed)
Rn call patient that her EEG was normal. Patient verbalized understanding.

## 2017-07-21 MED ORDER — DEXLANSOPRAZOLE 60 MG PO CPDR: 60.0000 mg | DELAYED_RELEASE_CAPSULE | Freq: Every day | ORAL | 2 refills | Status: DC

## 2017-07-21 NOTE — Telephone Encounter (Signed)
Patient called checking on status of medication refill for   dexlansoprazole (DEXILANT) 60 MG capsule , please f/up

## 2017-07-21 NOTE — Telephone Encounter (Signed)
Refill request

## 2017-07-21 NOTE — Telephone Encounter (Signed)
Refilled

## 2017-07-25 DIAGNOSIS — H401133 Primary open-angle glaucoma, bilateral, severe stage: Secondary | ICD-10-CM | POA: Diagnosis not present

## 2017-07-25 DIAGNOSIS — Z961 Presence of intraocular lens: Secondary | ICD-10-CM | POA: Diagnosis not present

## 2017-07-29 ENCOUNTER — Encounter: Payer: Self-pay | Admitting: Family Medicine

## 2017-07-29 ENCOUNTER — Ambulatory Visit: Payer: Medicare Other | Attending: Family Medicine | Admitting: Family Medicine

## 2017-07-29 VITALS — BP 123/75 | HR 74 | Temp 97.1°F | Resp 18 | Ht 68.0 in | Wt 178.0 lb

## 2017-07-29 DIAGNOSIS — J449 Chronic obstructive pulmonary disease, unspecified: Secondary | ICD-10-CM

## 2017-07-29 DIAGNOSIS — Z853 Personal history of malignant neoplasm of breast: Secondary | ICD-10-CM | POA: Diagnosis not present

## 2017-07-29 DIAGNOSIS — Z7982 Long term (current) use of aspirin: Secondary | ICD-10-CM | POA: Insufficient documentation

## 2017-07-29 DIAGNOSIS — I10 Essential (primary) hypertension: Secondary | ICD-10-CM | POA: Diagnosis not present

## 2017-07-29 DIAGNOSIS — Z79899 Other long term (current) drug therapy: Secondary | ICD-10-CM | POA: Diagnosis not present

## 2017-07-29 DIAGNOSIS — E785 Hyperlipidemia, unspecified: Secondary | ICD-10-CM | POA: Diagnosis not present

## 2017-07-29 DIAGNOSIS — M5136 Other intervertebral disc degeneration, lumbar region: Secondary | ICD-10-CM | POA: Insufficient documentation

## 2017-07-29 DIAGNOSIS — H409 Unspecified glaucoma: Secondary | ICD-10-CM | POA: Insufficient documentation

## 2017-07-29 DIAGNOSIS — G894 Chronic pain syndrome: Secondary | ICD-10-CM | POA: Insufficient documentation

## 2017-07-29 DIAGNOSIS — K279 Peptic ulcer, site unspecified, unspecified as acute or chronic, without hemorrhage or perforation: Secondary | ICD-10-CM | POA: Insufficient documentation

## 2017-07-29 DIAGNOSIS — M549 Dorsalgia, unspecified: Secondary | ICD-10-CM | POA: Diagnosis present

## 2017-07-29 MED ORDER — ALBUTEROL SULFATE (2.5 MG/3ML) 0.083% IN NEBU: 2.5000 mg | INHALATION_SOLUTION | Freq: Four times a day (QID) | RESPIRATORY_TRACT | 3 refills | Status: DC | PRN

## 2017-07-29 MED ORDER — FLUTICASONE-SALMETEROL 250-50 MCG/DOSE IN AEPB: 1.0000 | INHALATION_SPRAY | Freq: Two times a day (BID) | RESPIRATORY_TRACT | 3 refills | Status: DC

## 2017-07-29 MED ORDER — TIOTROPIUM BROMIDE MONOHYDRATE 18 MCG IN CAPS: 18.0000 ug | ORAL_CAPSULE | Freq: Every day | RESPIRATORY_TRACT | 3 refills | Status: DC

## 2017-07-29 MED ORDER — ALBUTEROL SULFATE HFA 108 (90 BASE) MCG/ACT IN AERS: 1.0000 | INHALATION_SPRAY | Freq: Four times a day (QID) | RESPIRATORY_TRACT | 3 refills | Status: DC | PRN

## 2017-07-29 NOTE — Progress Notes (Signed)
Patient has eaten  °Patient has had medication  °
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
## 2017-07-30 ENCOUNTER — Encounter: Payer: Self-pay | Admitting: Family Medicine

## 2017-08-04 DIAGNOSIS — H401133 Primary open-angle glaucoma, bilateral, severe stage: Secondary | ICD-10-CM | POA: Diagnosis not present

## 2017-08-04 DIAGNOSIS — Z961 Presence of intraocular lens: Secondary | ICD-10-CM | POA: Diagnosis not present

## 2017-08-04 DIAGNOSIS — J40 Bronchitis, not specified as acute or chronic: Secondary | ICD-10-CM | POA: Diagnosis not present

## 2017-08-20 ENCOUNTER — Other Ambulatory Visit: Payer: Self-pay | Admitting: Pharmacist

## 2017-08-20 ENCOUNTER — Emergency Department (HOSPITAL_COMMUNITY): Payer: Medicare Other

## 2017-08-20 ENCOUNTER — Encounter (HOSPITAL_COMMUNITY): Payer: Self-pay | Admitting: *Deleted

## 2017-08-20 ENCOUNTER — Emergency Department (HOSPITAL_COMMUNITY)
Admission: EM | Admit: 2017-08-20 | Discharge: 2017-08-21 | Disposition: A | Discharge status: Home / Self Care | Payer: Medicare Other | Attending: Emergency Medicine | Admitting: Emergency Medicine

## 2017-08-20 DIAGNOSIS — I10 Essential (primary) hypertension: Secondary | ICD-10-CM | POA: Diagnosis not present

## 2017-08-20 DIAGNOSIS — J449 Chronic obstructive pulmonary disease, unspecified: Secondary | ICD-10-CM | POA: Diagnosis not present

## 2017-08-20 DIAGNOSIS — J45909 Unspecified asthma, uncomplicated: Secondary | ICD-10-CM | POA: Diagnosis not present

## 2017-08-20 DIAGNOSIS — N899 Noninflammatory disorder of vagina, unspecified: Secondary | ICD-10-CM | POA: Diagnosis not present

## 2017-08-20 DIAGNOSIS — Z87891 Personal history of nicotine dependence: Secondary | ICD-10-CM | POA: Diagnosis not present

## 2017-08-20 DIAGNOSIS — R0789 Other chest pain: Secondary | ICD-10-CM | POA: Diagnosis not present

## 2017-08-20 DIAGNOSIS — R9431 Abnormal electrocardiogram [ECG] [EKG]: Secondary | ICD-10-CM | POA: Diagnosis not present

## 2017-08-20 DIAGNOSIS — Z7982 Long term (current) use of aspirin: Secondary | ICD-10-CM | POA: Insufficient documentation

## 2017-08-20 DIAGNOSIS — R05 Cough: Secondary | ICD-10-CM | POA: Diagnosis not present

## 2017-08-20 DIAGNOSIS — Z79899 Other long term (current) drug therapy: Secondary | ICD-10-CM | POA: Diagnosis not present

## 2017-08-20 DIAGNOSIS — J069 Acute upper respiratory infection, unspecified: Secondary | ICD-10-CM

## 2017-08-20 DIAGNOSIS — R079 Chest pain, unspecified: Secondary | ICD-10-CM | POA: Diagnosis not present

## 2017-08-20 LAB — BASIC METABOLIC PANEL
Anion gap: 6 (ref 5–15)
BUN: 12 mg/dL (ref 6–20)
CHLORIDE: 109 mmol/L (ref 101–111)
CO2: 25 mmol/L (ref 22–32)
CREATININE: 0.9 mg/dL (ref 0.44–1.00)
Calcium: 9.8 mg/dL (ref 8.9–10.3)
GFR calc Af Amer: >60 mL/min (ref >60)
GFR calc non Af Amer: >60 mL/min (ref >60)
GLUCOSE: 92 mg/dL (ref 65–99)
POTASSIUM: 3.7 mmol/L (ref 3.5–5.1)
Sodium: 140 mmol/L (ref 135–145)

## 2017-08-20 LAB — CBC
HEMATOCRIT: 42.5 % (ref 36.0–46.0)
Hemoglobin: 13.7 g/dL (ref 12.0–15.0)
MCH: 26.1 pg (ref 26.0–34.0)
MCHC: 32.2 g/dL (ref 30.0–36.0)
MCV: 81 fL (ref 78.0–100.0)
PLATELETS: 260 10*3/uL (ref 150–400)
RBC: 5.25 MIL/uL — ABNORMAL HIGH (ref 3.87–5.11)
RDW: 15.8 % — AB (ref 11.5–15.5)
WBC: 9 10*3/uL (ref 4.0–10.5)

## 2017-08-20 LAB — I-STAT TROPONIN, ED: Troponin i, poc: 0 ng/mL (ref 0.00–0.08)

## 2017-08-20 MED ORDER — PROAIR HFA 108 (90 BASE) MCG/ACT IN AERS: 1.0000 | INHALATION_SPRAY | Freq: Four times a day (QID) | RESPIRATORY_TRACT | 2 refills | Status: DC | PRN

## 2017-08-20 NOTE — ED Triage Notes (Signed)
Pt reports upper midline chest pain after eating, associated with nausea and dizziness. Pt also says she has a boil on the outside area of her vagina that is getting worse, noticed it three days ago, no drainage.  No meds PTA.

## 2017-08-21 ENCOUNTER — Other Ambulatory Visit: Payer: Self-pay

## 2017-08-21 DIAGNOSIS — H401133 Primary open-angle glaucoma, bilateral, severe stage: Secondary | ICD-10-CM | POA: Diagnosis not present

## 2017-08-21 DIAGNOSIS — R0789 Other chest pain: Secondary | ICD-10-CM | POA: Diagnosis not present

## 2017-08-21 LAB — I-STAT TROPONIN, ED: TROPONIN I, POC: 0 ng/mL (ref 0.00–0.08)

## 2017-08-21 MED ORDER — BENZONATATE 100 MG PO CAPS: 100.0000 mg | ORAL_CAPSULE | Freq: Three times a day (TID) | ORAL | 0 refills | Status: DC | PRN

## 2017-08-21 MED ORDER — AMOXICILLIN-POT CLAVULANATE 875-125 MG PO TABS: 1.0000 | ORAL_TABLET | Freq: Two times a day (BID) | ORAL | 0 refills | Status: DC

## 2017-08-21 MED ORDER — AMOXICILLIN-POT CLAVULANATE 875-125 MG PO TABS
1.0000 | ORAL_TABLET | Freq: Once | ORAL | Status: AC
  Administered 2017-08-21: 1 via ORAL
  Filled 2017-08-21: qty 1

## 2017-08-21 NOTE — ED Notes (Signed)
Pt departed in NAD, refused use of wheelchair.  
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
## 2017-08-21 NOTE — ED Notes (Signed)
No chest pain at this time 

## 2017-09-04 DIAGNOSIS — J40 Bronchitis, not specified as acute or chronic: Secondary | ICD-10-CM | POA: Diagnosis not present

## 2017-09-11 ENCOUNTER — Encounter: Payer: Self-pay | Admitting: Family Medicine

## 2017-09-11 ENCOUNTER — Ambulatory Visit: Payer: Medicare Other | Attending: Family Medicine | Admitting: Family Medicine

## 2017-09-11 VITALS — BP 130/70 | HR 74 | Temp 97.6°F | Ht 68.0 in | Wt 179.0 lb

## 2017-09-11 DIAGNOSIS — Z87891 Personal history of nicotine dependence: Secondary | ICD-10-CM | POA: Diagnosis not present

## 2017-09-11 DIAGNOSIS — J449 Chronic obstructive pulmonary disease, unspecified: Secondary | ICD-10-CM | POA: Insufficient documentation

## 2017-09-11 DIAGNOSIS — Z7982 Long term (current) use of aspirin: Secondary | ICD-10-CM | POA: Diagnosis not present

## 2017-09-11 DIAGNOSIS — Z79891 Long term (current) use of opiate analgesic: Secondary | ICD-10-CM | POA: Diagnosis not present

## 2017-09-11 DIAGNOSIS — I10 Essential (primary) hypertension: Secondary | ICD-10-CM

## 2017-09-11 DIAGNOSIS — Z791 Long term (current) use of non-steroidal anti-inflammatories (NSAID): Secondary | ICD-10-CM | POA: Insufficient documentation

## 2017-09-11 DIAGNOSIS — Z720 Tobacco use: Secondary | ICD-10-CM | POA: Insufficient documentation

## 2017-09-11 DIAGNOSIS — Z853 Personal history of malignant neoplasm of breast: Secondary | ICD-10-CM | POA: Diagnosis not present

## 2017-09-11 DIAGNOSIS — Z9010 Acquired absence of unspecified breast and nipple: Secondary | ICD-10-CM | POA: Diagnosis not present

## 2017-09-11 DIAGNOSIS — Z9889 Other specified postprocedural states: Secondary | ICD-10-CM | POA: Insufficient documentation

## 2017-09-11 DIAGNOSIS — R21 Rash and other nonspecific skin eruption: Secondary | ICD-10-CM | POA: Diagnosis not present

## 2017-09-11 DIAGNOSIS — H409 Unspecified glaucoma: Secondary | ICD-10-CM | POA: Insufficient documentation

## 2017-09-11 DIAGNOSIS — Z792 Long term (current) use of antibiotics: Secondary | ICD-10-CM | POA: Insufficient documentation

## 2017-09-11 DIAGNOSIS — G8929 Other chronic pain: Secondary | ICD-10-CM | POA: Diagnosis not present

## 2017-09-11 DIAGNOSIS — Z7951 Long term (current) use of inhaled steroids: Secondary | ICD-10-CM | POA: Diagnosis not present

## 2017-09-11 DIAGNOSIS — Z79899 Other long term (current) drug therapy: Secondary | ICD-10-CM | POA: Insufficient documentation

## 2017-09-11 DIAGNOSIS — Z87828 Personal history of other (healed) physical injury and trauma: Secondary | ICD-10-CM | POA: Diagnosis not present

## 2017-09-11 DIAGNOSIS — M545 Low back pain: Secondary | ICD-10-CM | POA: Diagnosis not present

## 2017-09-11 DIAGNOSIS — M25561 Pain in right knee: Secondary | ICD-10-CM | POA: Diagnosis not present

## 2017-09-11 DIAGNOSIS — K279 Peptic ulcer, site unspecified, unspecified as acute or chronic, without hemorrhage or perforation: Secondary | ICD-10-CM | POA: Insufficient documentation

## 2017-09-11 DIAGNOSIS — E785 Hyperlipidemia, unspecified: Secondary | ICD-10-CM | POA: Insufficient documentation

## 2017-09-11 MED ORDER — BUPROPION HCL ER (SR) 150 MG PO TB12: 150.0000 mg | ORAL_TABLET | Freq: Two times a day (BID) | ORAL | 2 refills | Status: DC

## 2017-09-11 MED ORDER — PREDNISONE 20 MG PO TABS: 20.0000 mg | ORAL_TABLET | Freq: Two times a day (BID) | ORAL | 0 refills | Status: DC

## 2017-09-11 NOTE — Patient Instructions (Signed)
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
## 2017-09-19 ENCOUNTER — Ambulatory Visit (HOSPITAL_COMMUNITY)
Admission: RE | Admit: 2017-09-19 | Discharge: 2017-09-19 | Disposition: A | Discharge status: Home / Self Care | Payer: Medicare Other | Source: Ambulatory Visit | Attending: Family Medicine | Admitting: Family Medicine

## 2017-09-19 DIAGNOSIS — M25561 Pain in right knee: Secondary | ICD-10-CM | POA: Insufficient documentation

## 2017-09-19 DIAGNOSIS — G8929 Other chronic pain: Secondary | ICD-10-CM | POA: Insufficient documentation

## 2017-09-25 ENCOUNTER — Telehealth: Payer: Self-pay

## 2017-09-25 DIAGNOSIS — M5136 Other intervertebral disc degeneration, lumbar region: Secondary | ICD-10-CM

## 2017-09-25 NOTE — Telephone Encounter (Signed)
Pt was called and informed of lab results. Pt states that pain clinic says she needs a new referral.

## 2017-09-26 NOTE — Addendum Note (Signed)
Addended by: Arnoldo Morale on: 09/26/2017 02:12 PM   Modules accepted: Orders

## 2017-09-26 NOTE — Telephone Encounter (Signed)
I have placed a new referral.  

## 2017-10-03 DIAGNOSIS — R937 Abnormal findings on diagnostic imaging of other parts of musculoskeletal system: Secondary | ICD-10-CM | POA: Diagnosis not present

## 2017-10-03 DIAGNOSIS — M50322 Other cervical disc degeneration at C5-C6 level: Secondary | ICD-10-CM | POA: Diagnosis not present

## 2017-10-03 DIAGNOSIS — M19011 Primary osteoarthritis, right shoulder: Secondary | ICD-10-CM | POA: Diagnosis not present

## 2017-10-03 DIAGNOSIS — M25511 Pain in right shoulder: Secondary | ICD-10-CM | POA: Diagnosis not present

## 2017-10-03 DIAGNOSIS — R935 Abnormal findings on diagnostic imaging of other abdominal regions, including retroperitoneum: Secondary | ICD-10-CM | POA: Diagnosis not present

## 2017-10-03 DIAGNOSIS — Z9010 Acquired absence of unspecified breast and nipple: Secondary | ICD-10-CM | POA: Diagnosis not present

## 2017-10-03 DIAGNOSIS — R001 Bradycardia, unspecified: Secondary | ICD-10-CM | POA: Diagnosis not present

## 2017-10-03 DIAGNOSIS — I10 Essential (primary) hypertension: Secondary | ICD-10-CM | POA: Diagnosis not present

## 2017-10-03 DIAGNOSIS — J9859 Other diseases of mediastinum, not elsewhere classified: Secondary | ICD-10-CM | POA: Diagnosis not present

## 2017-10-03 DIAGNOSIS — R229 Localized swelling, mass and lump, unspecified: Secondary | ICD-10-CM | POA: Diagnosis not present

## 2017-10-03 DIAGNOSIS — M50321 Other cervical disc degeneration at C4-C5 level: Secondary | ICD-10-CM | POA: Diagnosis not present

## 2017-10-03 DIAGNOSIS — M50323 Other cervical disc degeneration at C6-C7 level: Secondary | ICD-10-CM | POA: Diagnosis not present

## 2017-10-03 DIAGNOSIS — M5031 Other cervical disc degeneration,  high cervical region: Secondary | ICD-10-CM | POA: Diagnosis not present

## 2017-10-03 DIAGNOSIS — J984 Other disorders of lung: Secondary | ICD-10-CM | POA: Diagnosis not present

## 2017-10-03 DIAGNOSIS — I272 Pulmonary hypertension, unspecified: Secondary | ICD-10-CM | POA: Diagnosis not present

## 2017-10-03 DIAGNOSIS — M85811 Other specified disorders of bone density and structure, right shoulder: Secondary | ICD-10-CM | POA: Diagnosis not present

## 2017-10-03 DIAGNOSIS — R079 Chest pain, unspecified: Secondary | ICD-10-CM | POA: Diagnosis not present

## 2017-10-03 DIAGNOSIS — Z79899 Other long term (current) drug therapy: Secondary | ICD-10-CM | POA: Diagnosis not present

## 2017-10-03 DIAGNOSIS — R42 Dizziness and giddiness: Secondary | ICD-10-CM | POA: Diagnosis not present

## 2017-10-03 DIAGNOSIS — R590 Localized enlarged lymph nodes: Secondary | ICD-10-CM | POA: Diagnosis not present

## 2017-10-03 DIAGNOSIS — R222 Localized swelling, mass and lump, trunk: Secondary | ICD-10-CM | POA: Diagnosis not present

## 2017-10-04 DIAGNOSIS — J40 Bronchitis, not specified as acute or chronic: Secondary | ICD-10-CM | POA: Diagnosis not present

## 2017-10-22 DIAGNOSIS — R590 Localized enlarged lymph nodes: Secondary | ICD-10-CM | POA: Diagnosis not present

## 2017-10-23 DIAGNOSIS — R590 Localized enlarged lymph nodes: Secondary | ICD-10-CM | POA: Diagnosis not present

## 2017-10-23 DIAGNOSIS — J4540 Moderate persistent asthma, uncomplicated: Secondary | ICD-10-CM | POA: Diagnosis not present

## 2017-10-23 DIAGNOSIS — H401133 Primary open-angle glaucoma, bilateral, severe stage: Secondary | ICD-10-CM | POA: Diagnosis not present

## 2017-10-23 DIAGNOSIS — J9859 Other diseases of mediastinum, not elsewhere classified: Secondary | ICD-10-CM | POA: Diagnosis not present

## 2017-10-25 ENCOUNTER — Other Ambulatory Visit: Payer: Self-pay | Admitting: Critical Care Medicine

## 2017-10-26 ENCOUNTER — Other Ambulatory Visit: Payer: Self-pay | Admitting: Family Medicine

## 2017-10-28 DIAGNOSIS — M47816 Spondylosis without myelopathy or radiculopathy, lumbar region: Secondary | ICD-10-CM | POA: Diagnosis not present

## 2017-10-28 DIAGNOSIS — M25561 Pain in right knee: Secondary | ICD-10-CM | POA: Diagnosis not present

## 2017-10-28 DIAGNOSIS — Z5181 Encounter for therapeutic drug level monitoring: Secondary | ICD-10-CM | POA: Diagnosis not present

## 2017-10-28 DIAGNOSIS — G894 Chronic pain syndrome: Secondary | ICD-10-CM | POA: Diagnosis not present

## 2017-10-28 DIAGNOSIS — Z79899 Other long term (current) drug therapy: Secondary | ICD-10-CM | POA: Diagnosis not present

## 2017-10-28 DIAGNOSIS — M5136 Other intervertebral disc degeneration, lumbar region: Secondary | ICD-10-CM | POA: Diagnosis not present

## 2017-11-04 DIAGNOSIS — J40 Bronchitis, not specified as acute or chronic: Secondary | ICD-10-CM | POA: Diagnosis not present

## 2017-11-07 DIAGNOSIS — J320 Chronic maxillary sinusitis: Secondary | ICD-10-CM | POA: Diagnosis not present

## 2017-11-07 DIAGNOSIS — E041 Nontoxic single thyroid nodule: Secondary | ICD-10-CM | POA: Diagnosis not present

## 2017-11-07 DIAGNOSIS — R930 Abnormal findings on diagnostic imaging of skull and head, not elsewhere classified: Secondary | ICD-10-CM | POA: Diagnosis not present

## 2017-11-07 DIAGNOSIS — R9341 Abnormal radiologic findings on diagnostic imaging of renal pelvis, ureter, or bladder: Secondary | ICD-10-CM | POA: Diagnosis not present

## 2017-11-07 DIAGNOSIS — R932 Abnormal findings on diagnostic imaging of liver and biliary tract: Secondary | ICD-10-CM | POA: Diagnosis not present

## 2017-11-07 DIAGNOSIS — M47892 Other spondylosis, cervical region: Secondary | ICD-10-CM | POA: Diagnosis not present

## 2017-11-13 ENCOUNTER — Encounter: Payer: Self-pay | Admitting: Family Medicine

## 2017-11-13 ENCOUNTER — Ambulatory Visit: Payer: Medicare Other | Attending: Family Medicine | Admitting: Family Medicine

## 2017-11-13 VITALS — BP 149/84 | HR 74 | Temp 97.5°F | Ht 68.0 in | Wt 170.0 lb

## 2017-11-13 DIAGNOSIS — J449 Chronic obstructive pulmonary disease, unspecified: Secondary | ICD-10-CM | POA: Insufficient documentation

## 2017-11-13 DIAGNOSIS — Z853 Personal history of malignant neoplasm of breast: Secondary | ICD-10-CM | POA: Diagnosis not present

## 2017-11-13 DIAGNOSIS — Z79899 Other long term (current) drug therapy: Secondary | ICD-10-CM | POA: Diagnosis not present

## 2017-11-13 DIAGNOSIS — I10 Essential (primary) hypertension: Secondary | ICD-10-CM | POA: Diagnosis not present

## 2017-11-13 DIAGNOSIS — M25561 Pain in right knee: Secondary | ICD-10-CM | POA: Diagnosis not present

## 2017-11-13 DIAGNOSIS — Z7982 Long term (current) use of aspirin: Secondary | ICD-10-CM | POA: Diagnosis not present

## 2017-11-13 DIAGNOSIS — E041 Nontoxic single thyroid nodule: Secondary | ICD-10-CM | POA: Diagnosis not present

## 2017-11-13 DIAGNOSIS — E785 Hyperlipidemia, unspecified: Secondary | ICD-10-CM | POA: Diagnosis not present

## 2017-11-13 DIAGNOSIS — G894 Chronic pain syndrome: Secondary | ICD-10-CM | POA: Diagnosis not present

## 2017-11-13 DIAGNOSIS — M940 Chondrocostal junction syndrome [Tietze]: Secondary | ICD-10-CM | POA: Diagnosis not present

## 2017-11-13 DIAGNOSIS — H409 Unspecified glaucoma: Secondary | ICD-10-CM | POA: Diagnosis not present

## 2017-11-13 MED ORDER — MELOXICAM 7.5 MG PO TABS: 7.5000 mg | ORAL_TABLET | Freq: Every day | ORAL | 2 refills | Status: DC

## 2017-11-13 MED ORDER — DULOXETINE HCL 60 MG PO CPEP: 60.0000 mg | ORAL_CAPSULE | Freq: Every day | ORAL | 3 refills | Status: DC
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
## 2017-11-13 NOTE — Patient Instructions (Signed)
Costochondritis Costochondritis is swelling and irritation (inflammation) of the tissue (cartilage) that connects your ribs to your breastbone (sternum). This causes pain in the front of your chest. Usually, the pain:  Starts gradually.  Is in more than one rib.  This condition usually goes away on its own over time. Follow these instructions at home:  Do not do anything that makes your pain worse.  If directed, put ice on the painful area: ? Put ice in a plastic bag. ? Place a towel between your skin and the bag. ? Leave the ice on for 20 minutes, 2-3 times a day.  If directed, put heat on the affected area as often as told by your doctor. Use the heat source that your doctor tells you to use, such as a moist heat pack or a heating pad. ? Place a towel between your skin and the heat source. ? Leave the heat on for 20-30 minutes. ? Take off the heat if your skin turns bright red. This is very important if you cannot feel pain, heat, or cold. You may have a greater risk of getting burned.  Take over-the-counter and prescription medicines only as told by your doctor.  Return to your normal activities as told by your doctor. Ask your doctor what activities are safe for you.  Keep all follow-up visits as told by your doctor. This is important. Contact a doctor if:  You have chills or a fever.  Your pain does not go away or it gets worse.  You have a cough that does not go away. Get help right away if:  You are short of breath. This information is not intended to replace advice given to you by your health care provider. Make sure you discuss any questions you have with your health care provider. Document Released: 06/03/2008 Document Revised: 07/05/2016 Document Reviewed: 04/10/2016 Elsevier Interactive Patient Education  2018 [employer]  

## 2017-11-14 ENCOUNTER — Encounter: Payer: Self-pay | Admitting: Family Medicine

## 2017-11-24 ENCOUNTER — Telehealth: Payer: Self-pay | Admitting: Family Medicine

## 2017-11-24 NOTE — Telephone Encounter (Signed)
Pt called to request a refill on  Vitamin D, Ergocalciferol, (DRISDOL) 50000 units CAPS capsule Please follow up

## 2017-11-25 NOTE — Telephone Encounter (Signed)
This is not intended to be a long-term medication. Will forward to PCP for review

## 2017-11-25 NOTE — Telephone Encounter (Signed)
This huge dose of vitamin D is for short-term treatment of vitamin D deficiency.  Her last vitamin D level was normal; I would advise intake of OTC vitamin D and we can repeat her levels at her next visit

## 2017-11-26 ENCOUNTER — Telehealth: Payer: Self-pay

## 2017-11-26 NOTE — Telephone Encounter (Signed)
Pt was called and informed that Vit D was only for a short period of time. Pt was informed that she could buy OTC vit d, pt states that she does not have any money can something be sent to her pharmacy.

## 2017-11-28 ENCOUNTER — Telehealth: Payer: Self-pay

## 2017-11-28 MED ORDER — VITAMIN D 1000 UNITS PO TABS: 1000.0000 [IU] | ORAL_TABLET | Freq: Every day | ORAL | 2 refills | Status: DC

## 2017-11-28 NOTE — Telephone Encounter (Signed)
Pt was called and informed of medication being sent to pharmacy. 

## 2017-11-28 NOTE — Addendum Note (Signed)
Addended by: Arnoldo Morale on: 11/28/2017 01:39 PM   Modules accepted: Orders

## 2017-11-28 NOTE — Telephone Encounter (Signed)
I have sent a prescription for a maintenance dose of vitamin D to her pharmacy.

## 2017-12-03 DIAGNOSIS — R590 Localized enlarged lymph nodes: Secondary | ICD-10-CM | POA: Diagnosis not present

## 2017-12-04 DIAGNOSIS — L03032 Cellulitis of left toe: Secondary | ICD-10-CM | POA: Diagnosis not present

## 2017-12-04 DIAGNOSIS — J40 Bronchitis, not specified as acute or chronic: Secondary | ICD-10-CM | POA: Diagnosis not present

## 2017-12-04 DIAGNOSIS — M79675 Pain in left toe(s): Secondary | ICD-10-CM | POA: Diagnosis not present

## 2017-12-04 DIAGNOSIS — L02612 Cutaneous abscess of left foot: Secondary | ICD-10-CM | POA: Diagnosis not present

## 2017-12-04 DIAGNOSIS — B351 Tinea unguium: Secondary | ICD-10-CM | POA: Diagnosis not present

## 2017-12-07 ENCOUNTER — Other Ambulatory Visit: Payer: Self-pay | Admitting: Family Medicine

## 2017-12-07 DIAGNOSIS — Z720 Tobacco use: Secondary | ICD-10-CM

## 2017-12-09 DIAGNOSIS — B351 Tinea unguium: Secondary | ICD-10-CM | POA: Diagnosis not present

## 2017-12-16 DIAGNOSIS — D440 Neoplasm of uncertain behavior of thyroid gland: Secondary | ICD-10-CM | POA: Diagnosis not present

## 2017-12-16 DIAGNOSIS — H401133 Primary open-angle glaucoma, bilateral, severe stage: Secondary | ICD-10-CM | POA: Diagnosis not present

## 2017-12-18 DIAGNOSIS — L602 Onychogryphosis: Secondary | ICD-10-CM | POA: Diagnosis not present

## 2017-12-18 DIAGNOSIS — L03032 Cellulitis of left toe: Secondary | ICD-10-CM | POA: Diagnosis not present

## 2017-12-26 ENCOUNTER — Other Ambulatory Visit: Payer: Self-pay | Admitting: Otolaryngology

## 2018-01-04 DIAGNOSIS — J40 Bronchitis, not specified as acute or chronic: Secondary | ICD-10-CM | POA: Diagnosis not present

## 2018-01-07 ENCOUNTER — Other Ambulatory Visit: Payer: Self-pay | Admitting: Family Medicine

## 2018-01-13 ENCOUNTER — Other Ambulatory Visit: Payer: Self-pay

## 2018-01-13 ENCOUNTER — Encounter (HOSPITAL_BASED_OUTPATIENT_CLINIC_OR_DEPARTMENT_OTHER): Payer: Self-pay | Admitting: *Deleted

## 2018-01-19 ENCOUNTER — Other Ambulatory Visit: Payer: Self-pay | Admitting: Critical Care Medicine

## 2018-01-20 ENCOUNTER — Other Ambulatory Visit: Payer: Self-pay

## 2018-01-20 ENCOUNTER — Ambulatory Visit (HOSPITAL_BASED_OUTPATIENT_CLINIC_OR_DEPARTMENT_OTHER): Payer: Medicare Other | Admitting: Anesthesiology

## 2018-01-20 ENCOUNTER — Ambulatory Visit (HOSPITAL_BASED_OUTPATIENT_CLINIC_OR_DEPARTMENT_OTHER)
Admission: RE | Admit: 2018-01-20 | Discharge: 2018-01-21 | Disposition: A | Discharge status: Home / Self Care | Payer: Medicare Other | Source: Ambulatory Visit | Attending: Otolaryngology | Admitting: Otolaryngology

## 2018-01-20 ENCOUNTER — Ambulatory Visit (HOSPITAL_BASED_OUTPATIENT_CLINIC_OR_DEPARTMENT_OTHER)
Admission: RE | Disposition: A | Discharge status: Home / Self Care | Payer: Self-pay | Source: Ambulatory Visit | Attending: Otolaryngology

## 2018-01-20 ENCOUNTER — Encounter (HOSPITAL_BASED_OUTPATIENT_CLINIC_OR_DEPARTMENT_OTHER): Payer: Self-pay | Admitting: *Deleted

## 2018-01-20 DIAGNOSIS — D440 Neoplasm of uncertain behavior of thyroid gland: Secondary | ICD-10-CM | POA: Diagnosis not present

## 2018-01-20 DIAGNOSIS — E042 Nontoxic multinodular goiter: Secondary | ICD-10-CM | POA: Diagnosis not present

## 2018-01-20 DIAGNOSIS — F17210 Nicotine dependence, cigarettes, uncomplicated: Secondary | ICD-10-CM | POA: Insufficient documentation

## 2018-01-20 DIAGNOSIS — Z8673 Personal history of transient ischemic attack (TIA), and cerebral infarction without residual deficits: Secondary | ICD-10-CM | POA: Insufficient documentation

## 2018-01-20 DIAGNOSIS — I10 Essential (primary) hypertension: Secondary | ICD-10-CM | POA: Insufficient documentation

## 2018-01-20 DIAGNOSIS — Z853 Personal history of malignant neoplasm of breast: Secondary | ICD-10-CM | POA: Insufficient documentation

## 2018-01-20 DIAGNOSIS — E890 Postprocedural hypothyroidism: Secondary | ICD-10-CM

## 2018-01-20 DIAGNOSIS — Z9889 Other specified postprocedural states: Secondary | ICD-10-CM

## 2018-01-20 DIAGNOSIS — Z9011 Acquired absence of right breast and nipple: Secondary | ICD-10-CM | POA: Insufficient documentation

## 2018-01-20 DIAGNOSIS — G4730 Sleep apnea, unspecified: Secondary | ICD-10-CM | POA: Diagnosis not present

## 2018-01-20 DIAGNOSIS — H409 Unspecified glaucoma: Secondary | ICD-10-CM | POA: Insufficient documentation

## 2018-01-20 DIAGNOSIS — E041 Nontoxic single thyroid nodule: Secondary | ICD-10-CM | POA: Diagnosis not present

## 2018-01-20 DIAGNOSIS — J449 Chronic obstructive pulmonary disease, unspecified: Secondary | ICD-10-CM | POA: Diagnosis not present

## 2018-01-20 DIAGNOSIS — J4540 Moderate persistent asthma, uncomplicated: Secondary | ICD-10-CM | POA: Diagnosis not present

## 2018-01-20 HISTORY — PX: THYROIDECTOMY: SHX17

## 2018-01-20 HISTORY — DX: Unspecified glaucoma: H40.9

## 2018-01-20 HISTORY — DX: Sleep apnea, unspecified: G47.30

## 2018-01-20 HISTORY — DX: Gastro-esophageal reflux disease without esophagitis: K21.9

## 2018-01-20 HISTORY — DX: Major depressive disorder, single episode, unspecified: F32.9

## 2018-01-20 HISTORY — DX: Gastric ulcer, unspecified as acute or chronic, without hemorrhage or perforation: K25.9

## 2018-01-20 HISTORY — DX: Depression, unspecified: F32.A

## 2018-01-20 HISTORY — DX: Anxiety disorder, unspecified: F41.9

## 2018-01-20 SURGERY — THYROIDECTOMY
Anesthesia: General | Laterality: Right

## 2018-01-20 MED ORDER — HYDROMORPHONE HCL 1 MG/ML IJ SOLN
INTRAMUSCULAR | Status: AC
  Filled 2018-01-20: qty 0.5

## 2018-01-20 MED ORDER — MORPHINE SULFATE (PF) 2 MG/ML IV SOLN
2.0000 mg | INTRAVENOUS | Status: DC | PRN
  Administered 2018-01-21 (×2): 2 mg via INTRAVENOUS

## 2018-01-20 MED ORDER — HYDROMORPHONE HCL 1 MG/ML IJ SOLN
0.2500 mg | INTRAMUSCULAR | Status: DC | PRN
  Administered 2018-01-20 (×4): 0.5 mg via INTRAVENOUS

## 2018-01-20 MED ORDER — AMLODIPINE BESYLATE 5 MG PO TABS: 5.0000 mg | ORAL_TABLET | Freq: Every day | ORAL | Status: DC

## 2018-01-20 MED ORDER — EPHEDRINE 5 MG/ML INJ
INTRAVENOUS | Status: AC
  Filled 2018-01-20: qty 10

## 2018-01-20 MED ORDER — SCOPOLAMINE 1 MG/3DAYS TD PT72: 1.0000 | MEDICATED_PATCH | Freq: Once | TRANSDERMAL | Status: DC | PRN

## 2018-01-20 MED ORDER — MORPHINE SULFATE (PF) 4 MG/ML IV SOLN
INTRAVENOUS | Status: AC
  Filled 2018-01-20: qty 1

## 2018-01-20 MED ORDER — ACETAMINOPHEN 325 MG PO TABS
ORAL_TABLET | ORAL | Status: AC
  Filled 2018-01-20: qty 1

## 2018-01-20 MED ORDER — PHENYLEPHRINE HCL 10 MG/ML IJ SOLN
INTRAMUSCULAR | Status: DC | PRN
  Administered 2018-01-20 (×2): 40 ug via INTRAVENOUS

## 2018-01-20 MED ORDER — PRAVASTATIN SODIUM 40 MG PO TABS: 40.0000 mg | ORAL_TABLET | Freq: Every day | ORAL | Status: DC

## 2018-01-20 MED ORDER — OXYCODONE-ACETAMINOPHEN 5-325 MG PO TABS
ORAL_TABLET | ORAL | Status: AC
  Filled 2018-01-20: qty 1

## 2018-01-20 MED ORDER — LACTATED RINGERS IV SOLN
INTRAVENOUS | Status: DC
  Administered 2018-01-20 (×2): via INTRAVENOUS

## 2018-01-20 MED ORDER — PROPOFOL 10 MG/ML IV BOLUS
INTRAVENOUS | Status: AC
  Filled 2018-01-20: qty 20

## 2018-01-20 MED ORDER — MIDAZOLAM HCL 2 MG/2ML IJ SOLN
1.0000 mg | INTRAMUSCULAR | Status: DC | PRN
  Administered 2018-01-20: 2 mg via INTRAVENOUS

## 2018-01-20 MED ORDER — OXYCODONE HCL 5 MG PO TABS
ORAL_TABLET | ORAL | Status: AC
  Filled 2018-01-20: qty 1

## 2018-01-20 MED ORDER — DEXAMETHASONE SODIUM PHOSPHATE 10 MG/ML IJ SOLN
INTRAMUSCULAR | Status: AC
  Filled 2018-01-20: qty 1

## 2018-01-20 MED ORDER — HYDROCODONE-ACETAMINOPHEN 7.5-325 MG PO TABS: 1.0000 | ORAL_TABLET | Freq: Once | ORAL | Status: DC | PRN

## 2018-01-20 MED ORDER — DEXAMETHASONE SODIUM PHOSPHATE 4 MG/ML IJ SOLN
INTRAMUSCULAR | Status: DC | PRN
  Administered 2018-01-20: 8 mg via INTRAVENOUS

## 2018-01-20 MED ORDER — MORPHINE SULFATE (PF) 2 MG/ML IV SOLN: 2.0000 mg | INTRAVENOUS | Status: DC | PRN

## 2018-01-20 MED ORDER — OXYCODONE-ACETAMINOPHEN 5-325 MG PO TABS
ORAL_TABLET | ORAL | Status: AC
  Filled 2018-01-20: qty 2

## 2018-01-20 MED ORDER — SUCCINYLCHOLINE CHLORIDE 20 MG/ML IJ SOLN
INTRAMUSCULAR | Status: DC | PRN
  Administered 2018-01-20: 100 mg via INTRAVENOUS

## 2018-01-20 MED ORDER — OXYCODONE-ACETAMINOPHEN 5-325 MG PO TABS
1.0000 | ORAL_TABLET | ORAL | Status: DC | PRN
  Administered 2018-01-21: 1 via ORAL
  Administered 2018-01-21: 2 via ORAL
  Administered 2018-01-21: 1 via ORAL
  Filled 2018-01-20: qty 2

## 2018-01-20 MED ORDER — PANTOPRAZOLE SODIUM 40 MG PO TBEC: 40.0000 mg | DELAYED_RELEASE_TABLET | Freq: Every day | ORAL | Status: DC

## 2018-01-20 MED ORDER — OXYCODONE-ACETAMINOPHEN 10-325 MG PO TABS: 1.0000 | ORAL_TABLET | ORAL | 0 refills | Status: DC | PRN

## 2018-01-20 MED ORDER — LIDOCAINE-EPINEPHRINE 1 %-1:100000 IJ SOLN
INTRAMUSCULAR | Status: DC | PRN
  Administered 2018-01-20: 3.5 mL

## 2018-01-20 MED ORDER — ONDANSETRON HCL 4 MG/2ML IJ SOLN
INTRAMUSCULAR | Status: AC
  Filled 2018-01-20: qty 2

## 2018-01-20 MED ORDER — AMOXICILLIN 875 MG PO TABS: 875.0000 mg | ORAL_TABLET | Freq: Two times a day (BID) | ORAL | 0 refills | Status: AC

## 2018-01-20 MED ORDER — MORPHINE SULFATE (PF) 2 MG/ML IV SOLN
2.0000 mg | INTRAVENOUS | Status: DC | PRN
  Administered 2018-01-20 (×2): 2 mg via INTRAVENOUS

## 2018-01-20 MED ORDER — FENTANYL CITRATE (PF) 100 MCG/2ML IJ SOLN
INTRAMUSCULAR | Status: AC
  Filled 2018-01-20: qty 2

## 2018-01-20 MED ORDER — OXYCODONE-ACETAMINOPHEN 10-325 MG PO TABS
1.0000 | ORAL_TABLET | ORAL | Status: DC | PRN
  Administered 2018-01-20 (×3): 1 via ORAL

## 2018-01-20 MED ORDER — BRIMONIDINE TARTRATE 0.2 % OP SOLN
1.0000 [drp] | Freq: Three times a day (TID) | OPHTHALMIC | Status: DC
  Administered 2018-01-20: 1 [drp] via OPHTHALMIC

## 2018-01-20 MED ORDER — EPHEDRINE SULFATE 50 MG/ML IJ SOLN
INTRAMUSCULAR | Status: DC | PRN
  Administered 2018-01-20: 15 mg via INTRAVENOUS

## 2018-01-20 MED ORDER — SUCCINYLCHOLINE CHLORIDE 200 MG/10ML IV SOSY
PREFILLED_SYRINGE | INTRAVENOUS | Status: AC
  Filled 2018-01-20: qty 10

## 2018-01-20 MED ORDER — MIDAZOLAM HCL 2 MG/2ML IJ SOLN
INTRAMUSCULAR | Status: AC
  Filled 2018-01-20: qty 2

## 2018-01-20 MED ORDER — PROPOFOL 10 MG/ML IV BOLUS
INTRAVENOUS | Status: DC | PRN
  Administered 2018-01-20: 150 mg via INTRAVENOUS

## 2018-01-20 MED ORDER — ALBUTEROL SULFATE (2.5 MG/3ML) 0.083% IN NEBU: 2.5000 mg | INHALATION_SOLUTION | Freq: Four times a day (QID) | RESPIRATORY_TRACT | Status: DC | PRN

## 2018-01-20 MED ORDER — KCL IN DEXTROSE-NACL 20-5-0.45 MEQ/L-%-% IV SOLN
INTRAVENOUS | Status: DC
  Administered 2018-01-20: 15:00:00 via INTRAVENOUS
  Filled 2018-01-20: qty 1000

## 2018-01-20 MED ORDER — LIDOCAINE 2% (20 MG/ML) 5 ML SYRINGE
INTRAMUSCULAR | Status: AC
  Filled 2018-01-20: qty 5

## 2018-01-20 MED ORDER — PHENYLEPHRINE 40 MCG/ML (10ML) SYRINGE FOR IV PUSH (FOR BLOOD PRESSURE SUPPORT)
PREFILLED_SYRINGE | INTRAVENOUS | Status: AC
  Filled 2018-01-20: qty 10

## 2018-01-20 MED ORDER — OXYCODONE HCL 5 MG PO TABS
5.0000 mg | ORAL_TABLET | ORAL | Status: DC | PRN
  Administered 2018-01-21 (×3): 5 mg via ORAL
  Filled 2018-01-20: qty 1

## 2018-01-20 MED ORDER — ONDANSETRON HCL 4 MG/2ML IJ SOLN
INTRAMUSCULAR | Status: DC | PRN
  Administered 2018-01-20: 4 mg via INTRAVENOUS

## 2018-01-20 MED ORDER — ONDANSETRON HCL 4 MG/2ML IJ SOLN: 4.0000 mg | Freq: Once | INTRAMUSCULAR | Status: DC | PRN

## 2018-01-20 MED ORDER — MOMETASONE FURO-FORMOTEROL FUM 200-5 MCG/ACT IN AERO: 2.0000 | INHALATION_SPRAY | Freq: Two times a day (BID) | RESPIRATORY_TRACT | Status: DC

## 2018-01-20 MED ORDER — LIDOCAINE HCL (CARDIAC) 20 MG/ML IV SOLN
INTRAVENOUS | Status: DC | PRN
  Administered 2018-01-20: 100 mg via INTRAVENOUS

## 2018-01-20 MED ORDER — TIOTROPIUM BROMIDE MONOHYDRATE 18 MCG IN CAPS: 18.0000 ug | ORAL_CAPSULE | Freq: Every day | RESPIRATORY_TRACT | Status: DC

## 2018-01-20 MED ORDER — MEPERIDINE HCL 25 MG/ML IJ SOLN: 6.2500 mg | INTRAMUSCULAR | Status: DC | PRN

## 2018-01-20 MED ORDER — CEFAZOLIN SODIUM-DEXTROSE 2-3 GM-%(50ML) IV SOLR
INTRAVENOUS | Status: DC | PRN
  Administered 2018-01-20: 2 g via INTRAVENOUS

## 2018-01-20 MED ORDER — DEXTROSE 5 % IV SOLN
INTRAVENOUS | Status: DC | PRN
  Administered 2018-01-20: 50 ug/min via INTRAVENOUS

## 2018-01-20 MED ORDER — FENTANYL CITRATE (PF) 100 MCG/2ML IJ SOLN
50.0000 ug | INTRAMUSCULAR | Status: DC | PRN
  Administered 2018-01-20 (×2): 50 ug via INTRAVENOUS

## 2018-01-20 MED ORDER — OXYCODONE HCL 5 MG PO TABS
ORAL_TABLET | ORAL | Status: AC
  Filled 2018-01-20: qty 2

## 2018-01-20 SURGICAL SUPPLY — 65 items
ADH SKN CLS APL DERMABOND .7 (GAUZE/BANDAGES/DRESSINGS) ×1
ATTRACTOMAT 16X20 MAGNETIC DRP (DRAPES) ×2 IMPLANT
BLADE CLIPPER SURG (BLADE) IMPLANT
BLADE SURG 10 STRL SS (BLADE) IMPLANT
BLADE SURG 15 STRL LF DISP TIS (BLADE) ×1 IMPLANT
BLADE SURG 15 STRL SS (BLADE) ×3
CANISTER SUCT 1200ML W/VALVE (MISCELLANEOUS) ×3 IMPLANT
CLIP VESOCCLUDE SM WIDE 6/CT (CLIP) IMPLANT
CORD BIPOLAR FORCEPS 12FT (ELECTRODE) ×3 IMPLANT
COVER BACK TABLE 60X90IN (DRAPES) ×3 IMPLANT
COVER MAYO STAND STRL (DRAPES) ×3 IMPLANT
DECANTER SPIKE VIAL GLASS SM (MISCELLANEOUS) IMPLANT
DERMABOND ADVANCED (GAUZE/BANDAGES/DRESSINGS) ×2
DERMABOND ADVANCED .7 DNX12 (GAUZE/BANDAGES/DRESSINGS) ×1 IMPLANT
DRAIN CHANNEL 10F 3/8 F FF (DRAIN) ×2 IMPLANT
DRAIN CHANNEL 7F FF FLAT (WOUND CARE) IMPLANT
DRAPE U-SHAPE 76X120 STRL (DRAPES) ×3 IMPLANT
DRAPE UTILITY XL STRL (DRAPES) ×2 IMPLANT
ELECT COATED BLADE 2.86 ST (ELECTRODE) ×3 IMPLANT
ELECT REM PT RETURN 9FT ADLT (ELECTROSURGICAL) ×3
ELECTRODE REM PT RTRN 9FT ADLT (ELECTROSURGICAL) ×1 IMPLANT
EVACUATOR SILICONE 100CC (DRAIN) ×2 IMPLANT
FORCEPS BIPOLAR SPETZLER 8 1.0 (NEUROSURGERY SUPPLIES) ×3 IMPLANT
GAUZE SPONGE 4X4 12PLY STRL LF (GAUZE/BANDAGES/DRESSINGS) IMPLANT
GAUZE SPONGE 4X4 16PLY XRAY LF (GAUZE/BANDAGES/DRESSINGS) ×3 IMPLANT
GLOVE BIO SURGEON STRL SZ 6.5 (GLOVE) ×3 IMPLANT
GLOVE BIO SURGEON STRL SZ7.5 (GLOVE) ×3 IMPLANT
GLOVE BIO SURGEONS STRL SZ 6.5 (GLOVE) ×3
GLOVE BIOGEL PI IND STRL 7.0 (GLOVE) IMPLANT
GLOVE BIOGEL PI INDICATOR 7.0 (GLOVE) ×6
GOWN STRL REUS W/ TWL LRG LVL3 (GOWN DISPOSABLE) ×2 IMPLANT
GOWN STRL REUS W/ TWL XL LVL3 (GOWN DISPOSABLE) IMPLANT
GOWN STRL REUS W/TWL LRG LVL3 (GOWN DISPOSABLE) ×9
GOWN STRL REUS W/TWL XL LVL3 (GOWN DISPOSABLE) ×6
HEMOSTAT SURGICEL 2X14 (HEMOSTASIS) IMPLANT
NDL HYPO 25X1 1.5 SAFETY (NEEDLE) ×1 IMPLANT
NEEDLE HYPO 25X1 1.5 SAFETY (NEEDLE) ×3 IMPLANT
NS IRRIG 1000ML POUR BTL (IV SOLUTION) ×3 IMPLANT
PACK BASIN DAY SURGERY FS (CUSTOM PROCEDURE TRAY) ×3 IMPLANT
PENCIL BUTTON HOLSTER BLD 10FT (ELECTRODE) ×3 IMPLANT
PIN SAFETY STERILE (MISCELLANEOUS) IMPLANT
PROBE NERVBE PRASS .33 (MISCELLANEOUS) ×3 IMPLANT
SHEARS HARMONIC 9CM CVD (BLADE) ×3 IMPLANT
SLEEVE SCD COMPRESS KNEE MED (MISCELLANEOUS) ×3 IMPLANT
SPONGE INTESTINAL PEANUT (DISPOSABLE) ×5 IMPLANT
STAPLER VISISTAT 35W (STAPLE) IMPLANT
SUT ETHILON 3 0 PS 1 (SUTURE) ×3 IMPLANT
SUT PROLENE 5 0 P 3 (SUTURE) IMPLANT
SUT SILK 2 0 SH (SUTURE) ×3 IMPLANT
SUT SILK 2 0 TIES 17X18 (SUTURE)
SUT SILK 2-0 18XBRD TIE BLK (SUTURE) IMPLANT
SUT SILK 3 0 TIES 17X18 (SUTURE) ×6
SUT SILK 3-0 18XBRD TIE BLK (SUTURE) ×2 IMPLANT
SUT VIC AB 3-0 FS2 27 (SUTURE) ×3 IMPLANT
SUT VICRYL 4-0 PS2 18IN ABS (SUTURE) ×3 IMPLANT
SYR BULB 3OZ (MISCELLANEOUS) ×3 IMPLANT
SYR CONTROL 10ML LL (SYRINGE) ×3 IMPLANT
TOWEL OR 17X24 6PK STRL BLUE (TOWEL DISPOSABLE) ×6 IMPLANT
TRAY DSU PREP LF (CUSTOM PROCEDURE TRAY) ×3 IMPLANT
TUBE CONNECTING 20'X1/4 (TUBING) ×1
TUBE CONNECTING 20X1/4 (TUBING) ×2 IMPLANT
TUBE ENDOTRAC NIMS EMG 6MM (MISCELLANEOUS) IMPLANT
TUBE ENDOTRAC NIMS EMG 7MM (MISCELLANEOUS) ×2 IMPLANT
TUBE ENDOTRAC NIMS EMG 8MM (MISCELLANEOUS) IMPLANT
TUBE ENDOTRAC NIMS EMG 9MM (MISCELLANEOUS) IMPLANT
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
## 2018-01-20 NOTE — Progress Notes (Signed)
Patient having chest pain with a score of 9. Patient described mid chest pain with a burning sensation. BP stable, oxygen 100% on room air. MD notified. EKG performed showing NSR. No new orders. Will continue to monitor patient. Setzer, Marchelle Folks

## 2018-01-20 NOTE — Op Note (Signed)
DATE OF PROCEDURE:  01/20/2018                              OPERATIVE REPORT  SURGEON:  Leta Baptist, MD  PREOPERATIVE DIAGNOSES: 1. Right thyroid nodules, causing compressive symptoms  POSTOPERATIVE DIAGNOSES: 1. Right thyroid nodules, causing compressive symptoms  PROCEDURE PERFORMED:  Right total thyroid lobectomy  ANESTHESIA:  General endotracheal tube anesthesia.  COMPLICATIONS:  None.  ESTIMATED BLOOD LOSS:  50 ml.  INDICATION FOR PROCEDURE:  [name] is a 67 y.o. female with a history of 2 large right thyroid nodules. Her neck ultrasound showed a 4.6 cm and a 1.1 cm right thyroid nodules. The patient has been experiencing difficulty swallowing for some times. She noted tightness in her throat when she lays down at night. She would like to have the thyroid nodules removed. Based on the above findings, the decision was made for the patient to undergo the above-stated procedure. Likelihood of success in reducing symptoms was also discussed.  The risks, benefits, alternatives, and details of the procedure were discussed with the patient.  Questions were invited and answered.  Informed consent was obtained.  DESCRIPTION:  The patient was taken to the operating room and placed supine on the operating table.  General endotracheal tube anesthesia was administered by the anesthesiologist.  The patient was positioned and prepped and draped in a standard fashion for right hemithyroidectomy.   1% lidocaine with 1-100,000 epinephrine was infiltrated at the planned site of incision. A lower neck transverse incision was made. The incision was carried down to the level of the platysma muscles. Superiorly and inferiorly based subplatysmal flaps were elevated in a standard fashion. The strap muscles were divided at midline and retracted laterally, exposing the thyroid gland. The patient was noted to have a large right inferior thyroid mass. It was approximately 5 cm in diameter. The thyroid mass and the  rest of the right thyroid lobe was carefully dissected free from the surrounding soft tissue. The right recurrent laryngeal nerve was identified and preserved. The nerve was functional throughout the case. Hemostasis was achieved using a combination of suture ligations, bipolar electrocautery, and Harmonic Scalpel. The right thyroid lobe was sent to the pathology department for permanent histologic identification.   The surgical site was copiously irrigated. A #10 JP drain was placed. The incision was closed in layers with 4-0 Vicryl and Dermabond.  The care of the patient was turned over to the anesthesiologist.  The patient was awakened from anesthesia without difficulty.  The patient was extubated and transferred to the recovery room in good condition.  OPERATIVE FINDINGS:  Right thyroid nodules, with the largest one measuring approximately 5 cm in diameter.  SPECIMEN:  Right thyroid lobe.  FOLLOWUP CARE:  The patient will be observed overnight. She will most likely be discharged home on postop day #1.  Sundai Probert W Korey Arroyo 01/20/2018 11:18 AM

## 2018-01-20 NOTE — Anesthesia Postprocedure Evaluation (Signed)
Anesthesia Post Note  Patient: Leslie Allen  Procedure(s) Performed: RIGHT HEMI THYROIDECTOMY (Right )     Patient location during evaluation: PACU Anesthesia Type: General Level of consciousness: awake and alert and oriented Pain management: pain level controlled Vital Signs Assessment: post-procedure vital signs reviewed and stable Respiratory status: spontaneous breathing, nonlabored ventilation and respiratory function stable Cardiovascular status: blood pressure returned to baseline and stable Postop Assessment: no apparent nausea or vomiting Anesthetic complications: no                   Seymore Brodowski A.

## 2018-01-20 NOTE — Anesthesia Preprocedure Evaluation (Signed)
Anesthesia Evaluation  Patient identified by MRN, date of birth, ID band Patient awake    Reviewed: Allergy & Precautions, NPO status , Patient's Chart, lab work & pertinent test results  Airway Mallampati: II  TM Distance: >3 FB Neck ROM: Full    Dental no notable dental hx.    Pulmonary asthma , sleep apnea and Continuous Positive Airway Pressure Ventilation , COPD,  COPD inhaler, Current Smoker,    Pulmonary exam normal breath sounds clear to auscultation       Cardiovascular hypertension, Pt. on medications Normal cardiovascular exam Rhythm:Regular Rate:Normal     Neuro/Psych PSYCHIATRIC DISORDERS Anxiety Depression TIA   GI/Hepatic Neg liver ROS, PUD, GERD  Medicated and Controlled,  Endo/Other  Right Thyroid nodule Hyperlipidemia  Renal/GU negative Renal ROS  negative genitourinary   Musculoskeletal  (+) Arthritis , Osteoarthritis,    Abdominal   Peds  Hematology negative hematology ROS (+)   Anesthesia Other Findings   Reproductive/Obstetrics                             Anesthesia Physical Anesthesia Plan  ASA: III  Anesthesia Plan: General   Post-op Pain Management:    Induction: Intravenous  PONV Risk Score and Plan: 3  Airway Management Planned: Oral ETT  Additional Equipment:   Intra-op Plan:   Post-operative Plan: Extubation in OR  Informed Consent: I have reviewed the patients History and Physical, chart, labs and discussed the procedure including the risks, benefits and alternatives for the proposed anesthesia with the patient or authorized representative who has indicated his/her understanding and acceptance.   Dental advisory given  Plan Discussed with: CRNA, Anesthesiologist and Surgeon  Anesthesia Plan Comments:         Anesthesia Quick Evaluation

## 2018-01-20 NOTE — Transfer of Care (Signed)
Immediate Anesthesia Transfer of Care Note  Patient: Leslie Allen  Procedure(s) Performed: RIGHT HEMI THYROIDECTOMY (Right )  Patient Location: PACU  Anesthesia Type:General  Level of Consciousness: awake, alert  and oriented  Airway & Oxygen Therapy: Patient Spontanous Breathing and Patient connected to face mask oxygen  Post-op Assessment: Report given to RN and Post -op Vital signs reviewed and stable  Post vital signs: Reviewed and stable  Last Vitals:  Vitals:   01/20/18 0753  BP: (!) 129/91  Pulse: 65  Resp: 20  Temp: 36.6 C  SpO2: 100%    Last Pain:  Vitals:   01/20/18 0753  TempSrc: Oral      Patients Stated Pain Goal: 1 (34/37/35 7897)  Complications: No apparent anesthesia complications

## 2018-01-20 NOTE — Anesthesia Procedure Notes (Addendum)
Procedure Name: Intubation Performed by: Willa Frater, CRNA Pre-anesthesia Checklist: Patient identified, Emergency Drugs available, Suction available, Patient being monitored and Timeout performed Patient Re-evaluated:Patient Re-evaluated prior to induction Preoxygenation: Pre-oxygenation with 100% oxygen Induction Type: IV induction Ventilation: Mask ventilation without difficulty Laryngoscope Size: Mac and 3 Grade View: Grade I Tube type: Oral Tube size: 7.0 mm Number of attempts: 1 Airway Equipment and Method: Stylet Placement Confirmation: ETT inserted through vocal cords under direct vision,  positive ETCO2,  CO2 detector and breath sounds checked- equal and bilateral Secured at: 21 cm Tube secured with: Tape Dental Injury: Teeth and Oropharynx as per pre-operative assessment  Comments: Red wire on right and blue left, visualized and placed at appropriate depth. Tacey Heap

## 2018-01-20 NOTE — Discharge Instructions (Addendum)
Thyroidectomy, Care After  Refer to this sheet in the next few weeks. These instructions provide you with information about caring for yourself after your procedure. Your health care provider may also give you more specific instructions. Your treatment has been planned according to current medical practices, but problems sometimes occur. Call your health care provider if you have any problems or questions after your procedure.  What can I expect after the procedure? After your procedure, it is typical to have:  Mild pain in the neck or upper body, especially when swallowing.  A sore throat.  A weak voice.  Follow these instructions at home:  Take medicines only as directed by your health care provider.  Do not take medicines that contain aspirin and ibuprofen until your health care provider says that you can. These medicines can increase your risk of bleeding.  Some pain medicines cause constipation. Drink enough fluid to keep your urine clear or pale yellow. This can help to prevent constipation.  Start slowly with eating. You may need to have only liquids and soft foods for a few days or as directed by your health care provider.  There are many different ways to close and cover an incision, including stitches (sutures), skin glue, and adhesive strips. Follow your health care provider's instructions for: ? Incision care. ? Bandage (dressing) changes and removal. ? Incision closure removal.  Resume your usual activities as directed by your health care provider.  For the first 10 days after the procedure or as instructed by your health care provider: ? Do not lift anything heavier than 20 lb (9.1 kg). ? Do not jog, swim, or do other strenuous exercises. ? Do not play contact sports.  Keep all follow-up visits as directed by your health care provider. This is important.  Contact a health care provider if:  The soreness in your throat gets worse.  You have increased pain at your  incision or incisions.  You have increased bleeding from an incision.  Your incision becomes infected. Watch for: ? Swelling. ? Redness. ? Warmth. ? Pus.  You notice a bad smell coming from an incision or dressing.  You have a fever.  You feel lightheaded or faint.  You have numbness, tingling, or muscle spasms in your: ? Arms. ? Hands. ? Feet. ? Face.  You have trouble swallowing.  Get help right away if:  You develop a rash.  You have difficulty breathing.  You hear whistling noises coming from your chest.  You develop a cough that gets worse.  Your speech changes, or you have hoarseness that gets worse. This information is not intended to replace advice given to you by your health care provider. Make sure you discuss any questions you have with your health care provider. Document Released: 07/05/2005 Document Revised: 08/18/2016 Document Reviewed: 05/18/2014 Elsevier Interactive Patient Education  2018 [facility] Care Instructions  Activity: Get plenty of rest for the remainder of the day. A responsible individual must stay with you for 24 hours following the procedure.  For the next 24 hours, DO NOT: -Drive a car -Paediatric nurse -Drink alcoholic beverages -Take any medication unless instructed by your physician -Make any legal decisions or sign important papers.  Meals: Start with liquid foods such as gelatin or soup. Progress to regular foods as tolerated. Avoid greasy, spicy, heavy foods. If nausea and/or vomiting occur, drink only clear liquids until the nausea and/or vomiting subsides. Call your physician if vomiting continues.  Special Instructions/Symptoms: Your throat may feel dry or sore from the anesthesia or the breathing tube placed in your throat during surgery. If this causes discomfort, gargle with warm salt water. The discomfort should disappear within 24 hours.  If you had a scopolamine patch placed behind your  ear for the management of post- operative nausea and/or vomiting:  1. The medication in the patch is effective for 72 hours, after which it should be removed.  Wrap patch in a tissue and discard in the trash. Wash hands thoroughly with soap and water. 2. You may remove the patch earlier than 72 hours if you experience unpleasant side effects which may include dry mouth, dizziness or visual disturbances. 3. Avoid touching the patch. Wash your hands with soap and water after contact with the patch.

## 2018-01-21 DIAGNOSIS — I10 Essential (primary) hypertension: Secondary | ICD-10-CM | POA: Diagnosis not present

## 2018-01-21 DIAGNOSIS — Z9011 Acquired absence of right breast and nipple: Secondary | ICD-10-CM | POA: Diagnosis not present

## 2018-01-21 DIAGNOSIS — Z8673 Personal history of transient ischemic attack (TIA), and cerebral infarction without residual deficits: Secondary | ICD-10-CM | POA: Diagnosis not present

## 2018-01-21 DIAGNOSIS — Z853 Personal history of malignant neoplasm of breast: Secondary | ICD-10-CM | POA: Diagnosis not present

## 2018-01-21 DIAGNOSIS — H409 Unspecified glaucoma: Secondary | ICD-10-CM | POA: Diagnosis not present

## 2018-01-21 DIAGNOSIS — G4730 Sleep apnea, unspecified: Secondary | ICD-10-CM | POA: Diagnosis not present

## 2018-01-21 DIAGNOSIS — E042 Nontoxic multinodular goiter: Secondary | ICD-10-CM | POA: Diagnosis not present

## 2018-01-21 DIAGNOSIS — J449 Chronic obstructive pulmonary disease, unspecified: Secondary | ICD-10-CM | POA: Diagnosis not present

## 2018-01-21 MED ORDER — MORPHINE SULFATE (PF) 4 MG/ML IV SOLN
INTRAVENOUS | Status: AC
  Filled 2018-01-21: qty 1

## 2018-01-21 MED ORDER — OXYCODONE-ACETAMINOPHEN 5-325 MG PO TABS
ORAL_TABLET | ORAL | Status: AC
  Filled 2018-01-21: qty 1

## 2018-01-21 MED ORDER — OXYCODONE HCL 5 MG PO TABS
ORAL_TABLET | ORAL | Status: AC
  Filled 2018-01-21: qty 1

## 2018-01-21 NOTE — Addendum Note (Signed)
Addendum  created 01/21/18 0856 by Willa Frater, CRNA   Intraprocedure Staff edited

## 2018-01-22 ENCOUNTER — Encounter (HOSPITAL_BASED_OUTPATIENT_CLINIC_OR_DEPARTMENT_OTHER): Payer: Self-pay | Admitting: Otolaryngology

## 2018-01-22 MED FILL — Oxycodone w/ Acetaminophen Tab 5-325 MG: ORAL | Qty: 1 | Status: AC

## 2018-01-22 MED FILL — Oxycodone HCl Tab 5 MG: ORAL | Qty: 1 | Status: AC

## 2018-01-26 NOTE — Telephone Encounter (Signed)
Pt. Called requesting a refill on amLODipine (NORVASC) 5 MG tablet Pt. [facility] pharmacy. Uses Please f/u with pt.

## 2018-01-27 ENCOUNTER — Telehealth: Payer: Self-pay

## 2018-01-27 MED ORDER — AMLODIPINE BESYLATE 5 MG PO TABS: 5.0000 mg | ORAL_TABLET | Freq: Every day | ORAL | 6 refills | Status: DC

## 2018-01-27 NOTE — Addendum Note (Signed)
Addended byCharlott Rakes on: 01/27/2018 05:08 PM   Modules accepted: Orders

## 2018-01-27 NOTE — Telephone Encounter (Signed)
Done

## 2018-01-27 NOTE — Telephone Encounter (Signed)
Patient was called and informed of lab results. Patient states that she is out of BP medication. Please fill at [facility] on [address].

## 2018-01-28 NOTE — Telephone Encounter (Signed)
Patient was called and informed of medication refill. 

## 2018-01-29 DIAGNOSIS — G894 Chronic pain syndrome: Secondary | ICD-10-CM | POA: Diagnosis not present

## 2018-01-29 DIAGNOSIS — M25561 Pain in right knee: Secondary | ICD-10-CM | POA: Diagnosis not present

## 2018-01-29 DIAGNOSIS — M47816 Spondylosis without myelopathy or radiculopathy, lumbar region: Secondary | ICD-10-CM | POA: Diagnosis not present

## 2018-01-29 DIAGNOSIS — M5136 Other intervertebral disc degeneration, lumbar region: Secondary | ICD-10-CM | POA: Diagnosis not present

## 2018-02-01 ENCOUNTER — Other Ambulatory Visit: Payer: Self-pay | Admitting: Family Medicine

## 2018-02-01 DIAGNOSIS — M5136 Other intervertebral disc degeneration, lumbar region: Secondary | ICD-10-CM

## 2018-02-01 DIAGNOSIS — Z720 Tobacco use: Secondary | ICD-10-CM

## 2018-02-03 ENCOUNTER — Other Ambulatory Visit: Payer: Self-pay | Admitting: Family Medicine

## 2018-02-03 DIAGNOSIS — Z720 Tobacco use: Secondary | ICD-10-CM

## 2018-02-03 DIAGNOSIS — M5136 Other intervertebral disc degeneration, lumbar region: Secondary | ICD-10-CM

## 2018-02-04 DIAGNOSIS — J40 Bronchitis, not specified as acute or chronic: Secondary | ICD-10-CM | POA: Diagnosis not present

## 2018-02-13 ENCOUNTER — Encounter: Payer: Self-pay | Admitting: Family Medicine

## 2018-02-13 ENCOUNTER — Ambulatory Visit: Payer: Medicare Other | Attending: Family Medicine | Admitting: Family Medicine

## 2018-02-13 VITALS — BP 109/68 | HR 75 | Temp 98.0°F | Ht 68.0 in | Wt 173.6 lb

## 2018-02-13 DIAGNOSIS — J449 Chronic obstructive pulmonary disease, unspecified: Secondary | ICD-10-CM | POA: Diagnosis not present

## 2018-02-13 DIAGNOSIS — B079 Viral wart, unspecified: Secondary | ICD-10-CM | POA: Diagnosis not present

## 2018-02-13 DIAGNOSIS — G8929 Other chronic pain: Secondary | ICD-10-CM | POA: Insufficient documentation

## 2018-02-13 DIAGNOSIS — M5136 Other intervertebral disc degeneration, lumbar region: Secondary | ICD-10-CM | POA: Diagnosis not present

## 2018-02-13 DIAGNOSIS — H1013 Acute atopic conjunctivitis, bilateral: Secondary | ICD-10-CM | POA: Diagnosis not present

## 2018-02-13 DIAGNOSIS — G4730 Sleep apnea, unspecified: Secondary | ICD-10-CM | POA: Diagnosis not present

## 2018-02-13 DIAGNOSIS — Z853 Personal history of malignant neoplasm of breast: Secondary | ICD-10-CM | POA: Diagnosis not present

## 2018-02-13 DIAGNOSIS — K219 Gastro-esophageal reflux disease without esophagitis: Secondary | ICD-10-CM | POA: Diagnosis not present

## 2018-02-13 DIAGNOSIS — E890 Postprocedural hypothyroidism: Secondary | ICD-10-CM | POA: Diagnosis not present

## 2018-02-13 DIAGNOSIS — Z9889 Other specified postprocedural states: Secondary | ICD-10-CM | POA: Insufficient documentation

## 2018-02-13 DIAGNOSIS — Z79899 Other long term (current) drug therapy: Secondary | ICD-10-CM | POA: Insufficient documentation

## 2018-02-13 DIAGNOSIS — Z7951 Long term (current) use of inhaled steroids: Secondary | ICD-10-CM | POA: Diagnosis not present

## 2018-02-13 DIAGNOSIS — E785 Hyperlipidemia, unspecified: Secondary | ICD-10-CM | POA: Insufficient documentation

## 2018-02-13 DIAGNOSIS — I10 Essential (primary) hypertension: Secondary | ICD-10-CM | POA: Insufficient documentation

## 2018-02-13 DIAGNOSIS — Z8719 Personal history of other diseases of the digestive system: Secondary | ICD-10-CM | POA: Diagnosis not present

## 2018-02-13 DIAGNOSIS — F419 Anxiety disorder, unspecified: Secondary | ICD-10-CM | POA: Insufficient documentation

## 2018-02-13 DIAGNOSIS — Z79891 Long term (current) use of opiate analgesic: Secondary | ICD-10-CM | POA: Insufficient documentation

## 2018-02-13 DIAGNOSIS — Z87828 Personal history of other (healed) physical injury and trauma: Secondary | ICD-10-CM | POA: Insufficient documentation

## 2018-02-13 DIAGNOSIS — M25561 Pain in right knee: Secondary | ICD-10-CM

## 2018-02-13 DIAGNOSIS — Z720 Tobacco use: Secondary | ICD-10-CM

## 2018-02-13 DIAGNOSIS — F329 Major depressive disorder, single episode, unspecified: Secondary | ICD-10-CM | POA: Insufficient documentation

## 2018-02-13 DIAGNOSIS — E041 Nontoxic single thyroid nodule: Secondary | ICD-10-CM | POA: Insufficient documentation

## 2018-02-13 DIAGNOSIS — H409 Unspecified glaucoma: Secondary | ICD-10-CM | POA: Insufficient documentation

## 2018-02-13 DIAGNOSIS — Z9011 Acquired absence of right breast and nipple: Secondary | ICD-10-CM | POA: Insufficient documentation

## 2018-02-13 MED ORDER — MELOXICAM 7.5 MG PO TABS: 7.5000 mg | ORAL_TABLET | Freq: Every day | ORAL | 2 refills | Status: DC

## 2018-02-13 MED ORDER — DEXLANSOPRAZOLE 60 MG PO CPDR: DELAYED_RELEASE_CAPSULE | ORAL | 1 refills | Status: AC

## 2018-02-13 MED ORDER — OLOPATADINE HCL 0.1 % OP SOLN: 1.0000 [drp] | Freq: Two times a day (BID) | OPHTHALMIC | 1 refills | Status: AC

## 2018-02-13 MED ORDER — TIOTROPIUM BROMIDE MONOHYDRATE 18 MCG IN CAPS: 18.0000 ug | ORAL_CAPSULE | Freq: Every day | RESPIRATORY_TRACT | 3 refills | Status: AC

## 2018-02-13 MED ORDER — ALBUTEROL SULFATE (2.5 MG/3ML) 0.083% IN NEBU: 2.5000 mg | INHALATION_SOLUTION | Freq: Four times a day (QID) | RESPIRATORY_TRACT | 3 refills | Status: DC | PRN

## 2018-02-13 MED ORDER — BUPROPION HCL ER (SR) 150 MG PO TB12: ORAL_TABLET | ORAL | 0 refills | Status: DC

## 2018-02-13 MED ORDER — VITAMIN D 1000 UNITS PO TABS: 1000.0000 [IU] | ORAL_TABLET | Freq: Every day | ORAL | 2 refills | Status: DC

## 2018-02-13 MED ORDER — AMLODIPINE BESYLATE 5 MG PO TABS: 5.0000 mg | ORAL_TABLET | Freq: Every day | ORAL | 3 refills | Status: AC

## 2018-02-13 MED ORDER — CETIRIZINE HCL 10 MG PO TABS: 10.0000 mg | ORAL_TABLET | Freq: Every day | ORAL | 1 refills | Status: AC

## 2018-02-13 MED ORDER — PROAIR HFA 108 (90 BASE) MCG/ACT IN AERS: 1.0000 | INHALATION_SPRAY | Freq: Four times a day (QID) | RESPIRATORY_TRACT | 2 refills | Status: AC | PRN

## 2018-02-13 MED ORDER — TIZANIDINE HCL 4 MG PO TABS: ORAL_TABLET | ORAL | 0 refills | Status: AC

## 2018-02-13 MED ORDER — FLUTICASONE-SALMETEROL 250-50 MCG/DOSE IN AEPB: 1.0000 | INHALATION_SPRAY | Freq: Two times a day (BID) | RESPIRATORY_TRACT | 3 refills | Status: DC

## 2018-02-13 NOTE — Patient Instructions (Addendum)
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
## 2018-02-15 ENCOUNTER — Encounter: Payer: Self-pay | Admitting: Family Medicine

## 2018-02-15 ENCOUNTER — Other Ambulatory Visit: Payer: Self-pay | Admitting: Family Medicine

## 2018-02-15 DIAGNOSIS — M5136 Other intervertebral disc degeneration, lumbar region: Secondary | ICD-10-CM

## 2018-02-15 DIAGNOSIS — Z720 Tobacco use: Secondary | ICD-10-CM

## 2018-02-17 DIAGNOSIS — H401133 Primary open-angle glaucoma, bilateral, severe stage: Secondary | ICD-10-CM | POA: Diagnosis not present

## 2018-03-03 DIAGNOSIS — E041 Nontoxic single thyroid nodule: Secondary | ICD-10-CM | POA: Diagnosis not present

## 2018-03-03 DIAGNOSIS — M25569 Pain in unspecified knee: Secondary | ICD-10-CM | POA: Diagnosis not present

## 2018-03-03 DIAGNOSIS — M5136 Other intervertebral disc degeneration, lumbar region: Secondary | ICD-10-CM | POA: Diagnosis not present

## 2018-03-03 DIAGNOSIS — Z131 Encounter for screening for diabetes mellitus: Secondary | ICD-10-CM | POA: Diagnosis not present

## 2018-03-03 DIAGNOSIS — I10 Essential (primary) hypertension: Secondary | ICD-10-CM | POA: Diagnosis not present

## 2018-03-03 DIAGNOSIS — G894 Chronic pain syndrome: Secondary | ICD-10-CM | POA: Diagnosis not present

## 2018-03-03 DIAGNOSIS — E7849 Other hyperlipidemia: Secondary | ICD-10-CM | POA: Diagnosis not present

## 2018-03-03 DIAGNOSIS — Z0000 Encounter for general adult medical examination without abnormal findings: Secondary | ICD-10-CM | POA: Diagnosis not present

## 2018-03-04 DIAGNOSIS — J40 Bronchitis, not specified as acute or chronic: Secondary | ICD-10-CM | POA: Diagnosis not present

## 2018-03-24 DIAGNOSIS — B351 Tinea unguium: Secondary | ICD-10-CM | POA: Diagnosis not present

## 2018-04-04 ENCOUNTER — Encounter (HOSPITAL_COMMUNITY): Payer: Self-pay

## 2018-04-04 ENCOUNTER — Emergency Department (HOSPITAL_COMMUNITY)
Admission: EM | Admit: 2018-04-04 | Discharge: 2018-04-04 | Disposition: A | Discharge status: Home / Self Care | Payer: Medicare Other | Attending: Emergency Medicine | Admitting: Emergency Medicine

## 2018-04-04 ENCOUNTER — Other Ambulatory Visit: Payer: Self-pay

## 2018-04-04 DIAGNOSIS — R110 Nausea: Secondary | ICD-10-CM | POA: Insufficient documentation

## 2018-04-04 DIAGNOSIS — R42 Dizziness and giddiness: Secondary | ICD-10-CM | POA: Diagnosis not present

## 2018-04-04 DIAGNOSIS — I10 Essential (primary) hypertension: Secondary | ICD-10-CM | POA: Insufficient documentation

## 2018-04-04 DIAGNOSIS — Z87891 Personal history of nicotine dependence: Secondary | ICD-10-CM | POA: Insufficient documentation

## 2018-04-04 DIAGNOSIS — Z79899 Other long term (current) drug therapy: Secondary | ICD-10-CM | POA: Insufficient documentation

## 2018-04-04 DIAGNOSIS — Z853 Personal history of malignant neoplasm of breast: Secondary | ICD-10-CM | POA: Insufficient documentation

## 2018-04-04 DIAGNOSIS — J449 Chronic obstructive pulmonary disease, unspecified: Secondary | ICD-10-CM | POA: Insufficient documentation

## 2018-04-04 DIAGNOSIS — R1013 Epigastric pain: Secondary | ICD-10-CM | POA: Diagnosis present

## 2018-04-04 DIAGNOSIS — K2900 Acute gastritis without bleeding: Secondary | ICD-10-CM | POA: Diagnosis not present

## 2018-04-04 LAB — CBC
HEMATOCRIT: 46.7 % — AB (ref 36.0–46.0)
HEMOGLOBIN: 15.5 g/dL — AB (ref 12.0–15.0)
MCH: 27.9 pg (ref 26.0–34.0)
MCHC: 33.2 g/dL (ref 30.0–36.0)
MCV: 84.1 fL (ref 78.0–100.0)
PLATELETS: 252 10*3/uL (ref 150–400)
RBC: 5.55 MIL/uL — AB (ref 3.87–5.11)
RDW: 15.5 % (ref 11.5–15.5)
WBC: 7 10*3/uL (ref 4.0–10.5)

## 2018-04-04 LAB — COMPREHENSIVE METABOLIC PANEL
ALT: 21 U/L (ref 14–54)
AST: 22 U/L (ref 15–41)
Albumin: 4.3 g/dL (ref 3.5–5.0)
Alkaline Phosphatase: 76 U/L (ref 38–126)
Anion gap: 7 (ref 5–15)
BUN: 14 mg/dL (ref 6–20)
CHLORIDE: 107 mmol/L (ref 101–111)
CO2: 28 mmol/L (ref 22–32)
CREATININE: 0.84 mg/dL (ref 0.44–1.00)
Calcium: 9.8 mg/dL (ref 8.9–10.3)
GFR calc non Af Amer: >60 mL/min (ref >60)
Glucose, Bld: 97 mg/dL (ref 65–99)
Potassium: 3.7 mmol/L (ref 3.5–5.1)
SODIUM: 142 mmol/L (ref 135–145)
Total Bilirubin: 1.8 mg/dL — ABNORMAL HIGH (ref 0.3–1.2)
Total Protein: 8.1 g/dL (ref 6.5–8.1)

## 2018-04-04 LAB — TYPE AND SCREEN
ABO/RH(D): O POS
Antibody Screen: NEGATIVE

## 2018-04-04 LAB — LIPASE, BLOOD: LIPASE: 27 U/L (ref 11–51)

## 2018-04-04 LAB — ABO/RH: ABO/RH(D): O POS

## 2018-04-04 MED ORDER — SUCRALFATE 1 G PO TABS: 1.0000 g | ORAL_TABLET | Freq: Three times a day (TID) | ORAL | 1 refills | Status: AC

## 2018-04-04 MED ORDER — SODIUM CHLORIDE 0.9 % IV BOLUS
1000.0000 mL | Freq: Once | INTRAVENOUS | Status: AC
  Administered 2018-04-04: 1000 mL via INTRAVENOUS

## 2018-04-04 MED ORDER — ONDANSETRON HCL 4 MG/2ML IJ SOLN
4.0000 mg | Freq: Once | INTRAMUSCULAR | Status: AC
  Administered 2018-04-04: 4 mg via INTRAVENOUS
  Filled 2018-04-04: qty 2

## 2018-04-04 MED ORDER — MORPHINE SULFATE (PF) 4 MG/ML IV SOLN
4.0000 mg | Freq: Once | INTRAVENOUS | Status: AC
  Administered 2018-04-04: 4 mg via INTRAVENOUS
  Filled 2018-04-04: qty 1

## 2018-04-04 MED ORDER — GI COCKTAIL ~~LOC~~
30.0000 mL | Freq: Once | ORAL | Status: AC
  Administered 2018-04-04: 30 mL via ORAL
  Filled 2018-04-04: qty 30

## 2018-04-04 MED ORDER — FAMOTIDINE IN NACL 20-0.9 MG/50ML-% IV SOLN
20.0000 mg | Freq: Once | INTRAVENOUS | Status: AC
  Administered 2018-04-04: 20 mg via INTRAVENOUS
  Filled 2018-04-04: qty 50

## 2018-04-04 NOTE — ED Triage Notes (Signed)
Patient c/o small amounts black stools and abdominal pain that she describes as burning x 4 days. The last black stool was this AM. Patient also reports that she has been dizzy since yesterday.

## 2018-04-04 NOTE — Discharge Instructions (Signed)
Avoid Alka-Seltzer, aspirin, Advil, Aleve, naproxen.  Tylenol is okay to take for pain.  Avoid alcohol and spicy foods.  Make sure you are eating small meals frequently.

## 2018-04-04 NOTE — ED Notes (Signed)
Pt given po fluids and crackers
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
## 2018-04-08 ENCOUNTER — Other Ambulatory Visit: Payer: Self-pay | Admitting: Gastroenterology

## 2018-04-08 DIAGNOSIS — R1084 Generalized abdominal pain: Secondary | ICD-10-CM

## 2018-04-16 ENCOUNTER — Ambulatory Visit
Admission: RE | Admit: 2018-04-16 | Discharge: 2018-04-16 | Disposition: A | Discharge status: Home / Self Care | Payer: Medicare Other | Source: Ambulatory Visit | Attending: Gastroenterology | Admitting: Gastroenterology

## 2018-04-16 DIAGNOSIS — R1084 Generalized abdominal pain: Secondary | ICD-10-CM

## 2018-04-16 MED ORDER — IOPAMIDOL (ISOVUE-300) INJECTION 61%
100.0000 mL | Freq: Once | INTRAVENOUS | Status: AC | PRN
  Administered 2018-04-16: 100 mL via INTRAVENOUS

## 2018-04-26 ENCOUNTER — Other Ambulatory Visit: Payer: Self-pay | Admitting: Family Medicine

## 2018-04-26 DIAGNOSIS — M5136 Other intervertebral disc degeneration, lumbar region: Secondary | ICD-10-CM

## 2018-05-04 IMAGING — CR DG KNEE COMPLETE 4+V*R*
6 series · 6 of 6 positions shown · non-contrast
Comparison: 03/26/2017 .

CLINICAL DATA: Right knee pain.  No reported injury.

EXAM:
RIGHT KNEE - COMPLETE 4+ VIEW

[knee ap]
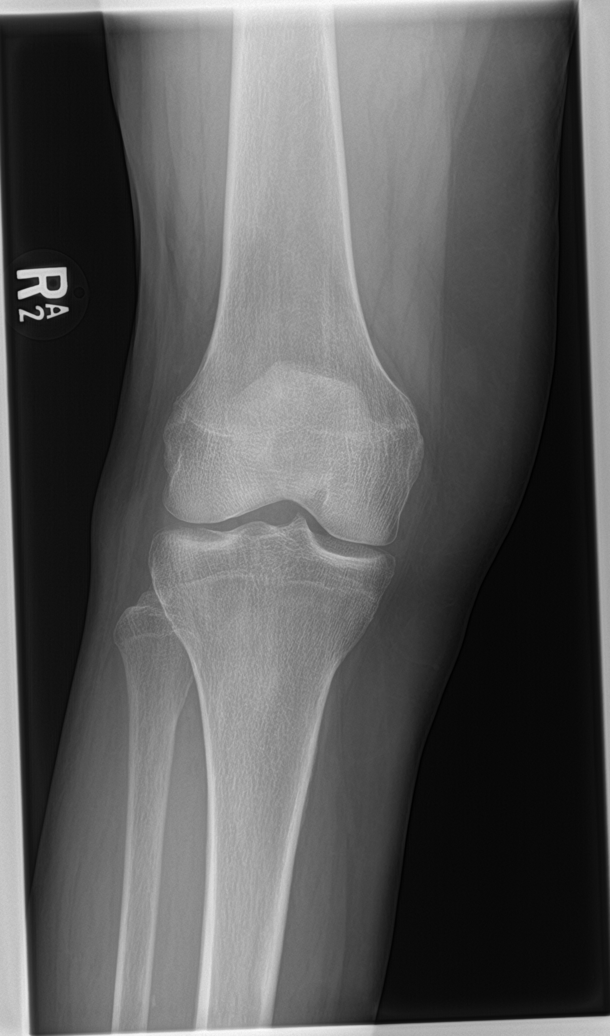

[tunnel (1 of 2)]
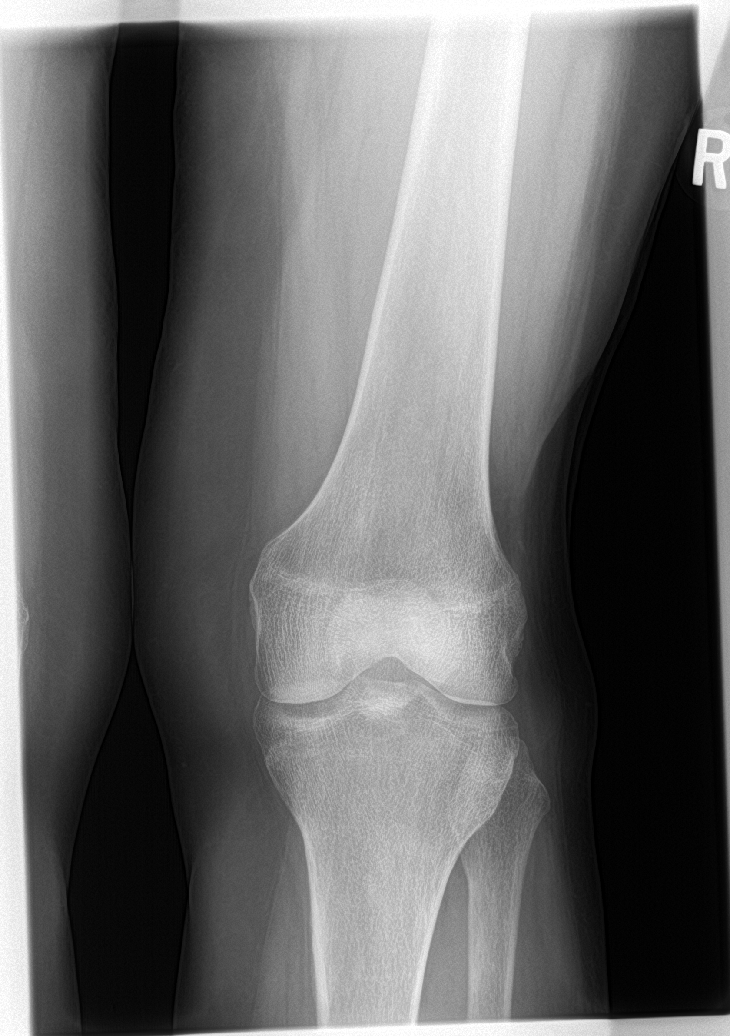

[knee lat]
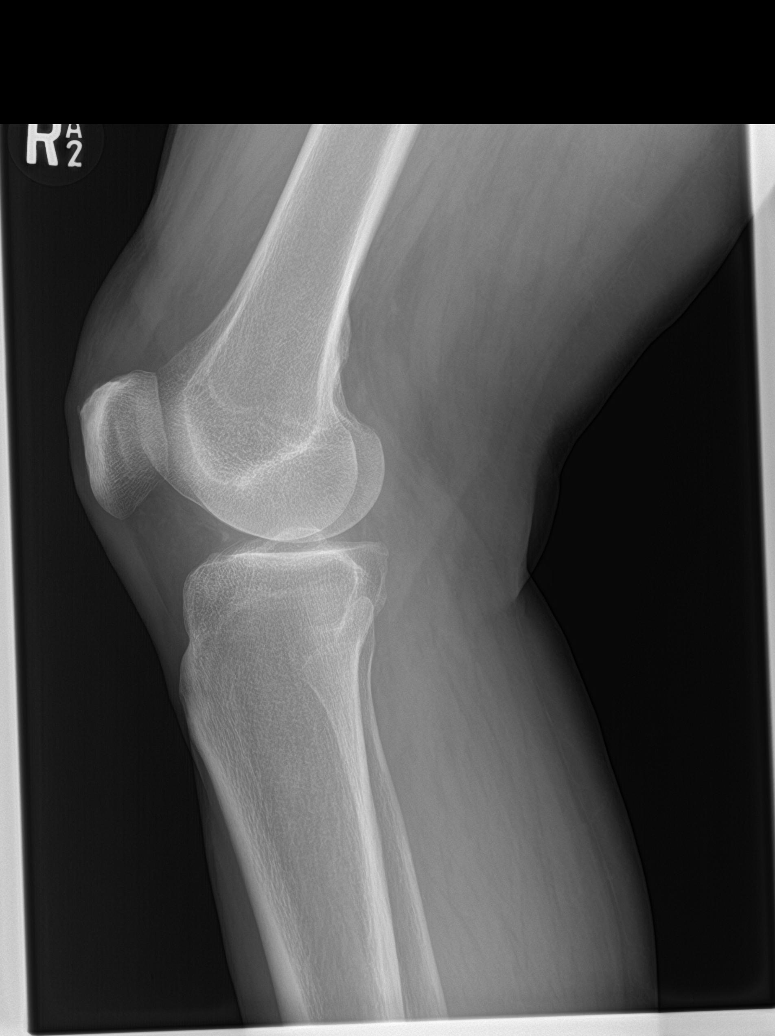

[knee sunrise (1 of 2)]
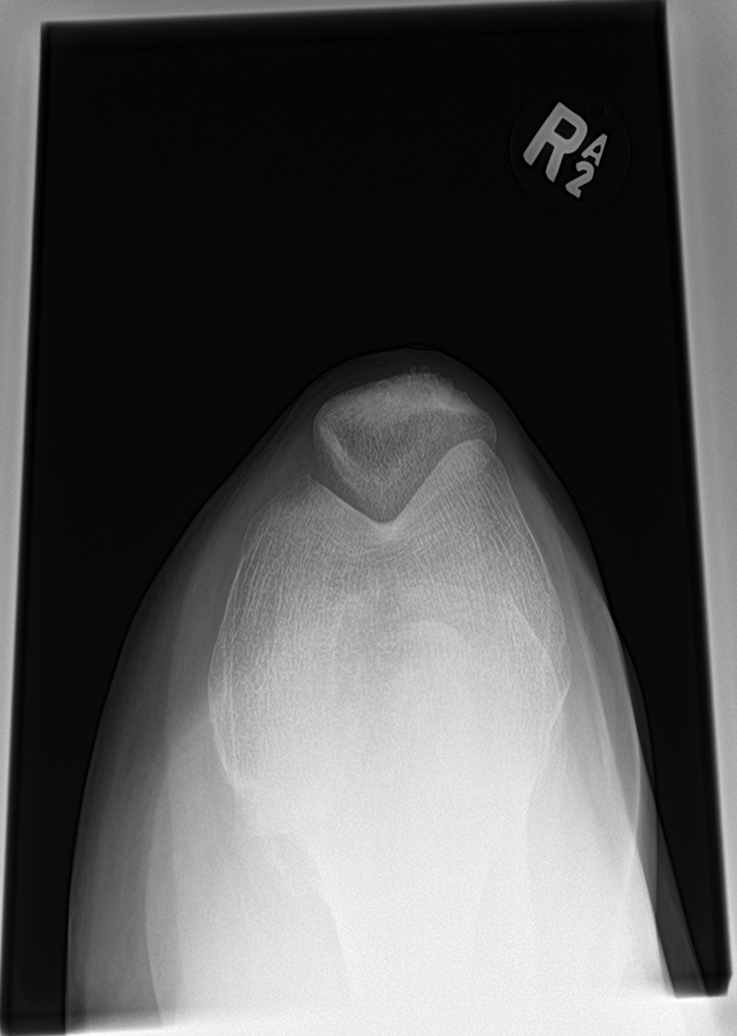

[tunnel (2 of 2)]
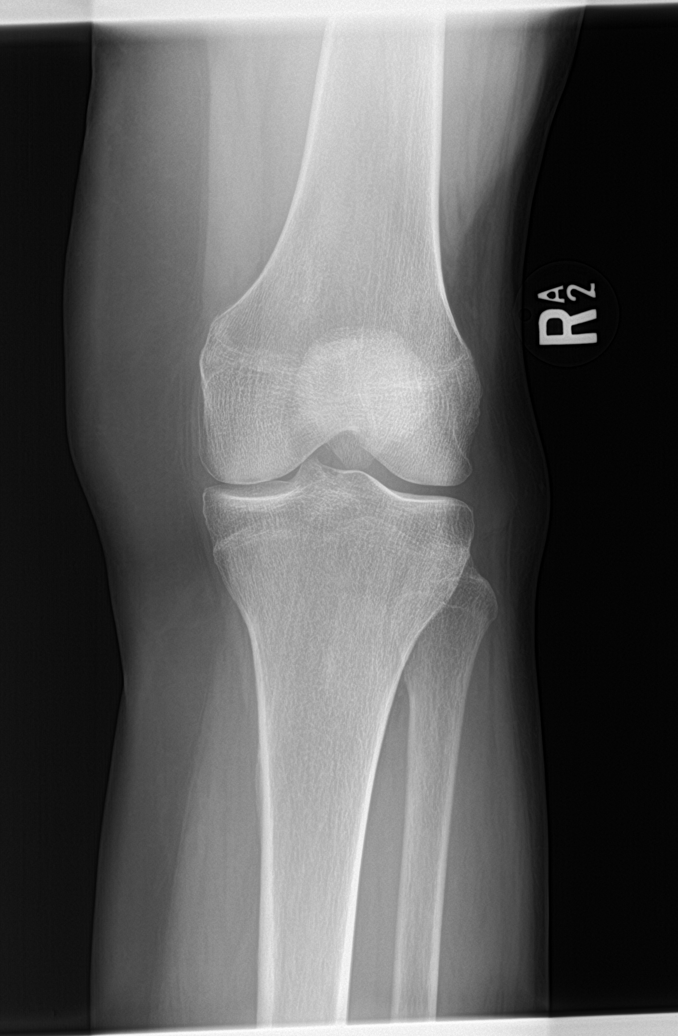

[knee sunrise (2 of 2)]
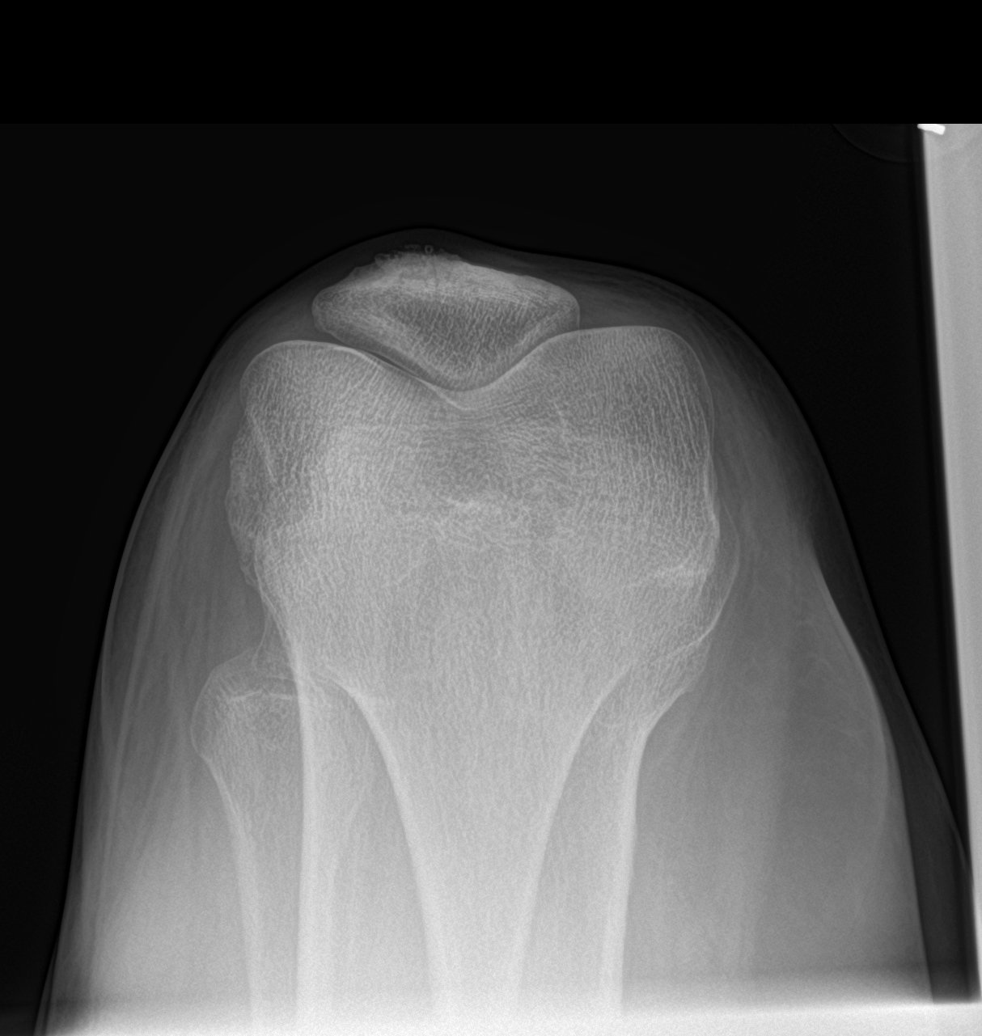

[6 of 6 positions shown; findings below may reference images not displayed]

FINDINGS: No acute bony or joint abnormality identified. No evidence of
fracture or dislocation.
IMPRESSION: No acute abnormality.

## 2018-06-22 ENCOUNTER — Other Ambulatory Visit: Payer: Self-pay | Admitting: Urology

## 2018-07-28 ENCOUNTER — Other Ambulatory Visit: Payer: Self-pay | Admitting: Internal Medicine

## 2018-07-28 ENCOUNTER — Other Ambulatory Visit: Payer: Self-pay | Admitting: Gastroenterology

## 2018-07-28 DIAGNOSIS — N289 Disorder of kidney and ureter, unspecified: Secondary | ICD-10-CM

## 2018-07-28 DIAGNOSIS — K7689 Other specified diseases of liver: Secondary | ICD-10-CM

## 2018-07-30 NOTE — Patient Instructions (Signed)
Leslie Allen  07/30/2018   Your procedure is scheduled on: 08-05-18   Report to [facility] Main  Entrance    Report to admitting at 11:15AM    Call this number if you have problems the morning of surgery [phone]     PLEASE BRING CPAP MASK AND  TUBING ONLY. DEVICE WILL BE PROVIDED!   Remember: Do not eat food After Midnight.YOU MAY HAVE CLEAR LIQUIDS FROM MIDNIGHT UNTIL 7:15AM. NOTHING BY MOUTH AFTER 7:15AM!     Take these medicines the morning of surgery with A SIP OF WATER: amlodipine, bupropion, dexilant, linzess, zyrtec, inhalers as needed (please bring rescue inhaler)                                You may not have any metal on your body including hair pins and              piercings  Do not wear jewelry, make-up, lotions, powders or perfumes, deodorant             Do not wear nail polish.  Do not shave  48 hours prior to surgery.            Do not bring valuables to the hospital. [place].  Contacts, dentures or bridgework may not be worn into surgery.  Leave suitcase in the car. After surgery it may be brought to your room.                  Please read over the following fact sheets you were given: _____________________________________________________________________    CLEAR LIQUID DIET   Foods Allowed                                                                     Foods Excluded  Coffee and tea, regular and decaf                             liquids that you cannot  Plain Jell-O in any flavor                                             see through such as: Fruit ices (not with fruit pulp)                                     milk, soups, orange juice  Iced Popsicles                                    All solid food Carbonated beverages, regular and diet  Cranberry, grape and apple juices Sports drinks like Gatorade Lightly seasoned clear broth or  consume(fat free) Sugar, honey syrup  Sample Menu Breakfast                                Lunch                                     Supper Cranberry juice                    Beef broth                            Chicken broth Jell-O                                     Grape juice                           Apple juice Coffee or tea                        Jell-O                                      Popsicle                                                Coffee or tea                        Coffee or tea  _____________________________________________________________________  [facility] - Preparing for Surgery Before surgery, you can play an important role.  Because skin is not sterile, your skin needs to be as free of germs as possible.  You can reduce the number of germs on your skin by washing with CHG (chlorahexidine gluconate) soap before surgery.  CHG is an antiseptic cleaner which kills germs and bonds with the skin to continue killing germs even after washing. Please DO NOT use if you have an allergy to CHG or antibacterial soaps.  If your skin becomes reddened/irritated stop using the CHG and inform your nurse when you arrive at Short Stay. Do not shave (including legs and underarms) for at least 48 hours prior to the first CHG shower.  You may shave your face/neck. Please follow these instructions carefully:  1.  Shower with CHG Soap the night before surgery and the  morning of Surgery.  2.  If you choose to wash your hair, wash your hair first as usual with your  normal  shampoo.  3.  After you shampoo, rinse your hair and body thoroughly to remove the  shampoo.                           4.  Use CHG as you would any other liquid soap.  You can apply chg directly  to the skin and wash  Gently with a scrungie or clean washcloth.  5.  Apply the CHG Soap to your body ONLY FROM THE NECK DOWN.   Do not use on face/ open                           Wound or open sores.  Avoid contact with eyes, ears mouth and genitals (private parts).                       Wash face,  Genitals (private parts) with your normal soap.             6.  Wash thoroughly, paying special attention to the area where your surgery  will be performed.  7.  Thoroughly rinse your body with warm water from the neck down.  8.  DO NOT shower/wash with your normal soap after using and rinsing off  the CHG Soap.                9.  Pat yourself dry with a clean towel.            10.  Wear clean pajamas.            11.  Place clean sheets on your bed the night of your first shower and do not  sleep with pets. Day of Surgery : Do not apply any lotions/deodorants the morning of surgery.  Please wear clean clothes to the hospital/surgery center.  FAILURE TO FOLLOW THESE INSTRUCTIONS MAY RESULT IN THE CANCELLATION OF YOUR SURGERY PATIENT SIGNATURE_________________________________  NURSE SIGNATURE__________________________________  ________________________________________________________________________  WHAT IS A BLOOD TRANSFUSION? Blood Transfusion Information  A transfusion is the replacement of blood or some of its parts. Blood is made up of multiple cells which provide different functions.  Red blood cells carry oxygen and are used for blood loss replacement.  White blood cells fight against infection.  Platelets control bleeding.  Plasma helps clot blood.  Other blood products are available for specialized needs, such as hemophilia or other clotting disorders. BEFORE THE TRANSFUSION  Who gives blood for transfusions?   Healthy volunteers who are fully evaluated to make sure their blood is safe. This is blood bank blood. Transfusion therapy is the safest it has ever been in the practice of medicine. Before blood is taken from a donor, a complete history is taken to make sure that person has no history of diseases nor engages in risky social behavior (examples are intravenous drug use  or sexual activity with multiple partners). The donor's travel history is screened to minimize risk of transmitting infections, such as malaria. The donated blood is tested for signs of infectious diseases, such as HIV and hepatitis. The blood is then tested to be sure it is compatible with you in order to minimize the chance of a transfusion reaction. If you or a relative donates blood, this is often done in anticipation of surgery and is not appropriate for emergency situations. It takes many days to process the donated blood. RISKS AND COMPLICATIONS Although transfusion therapy is very safe and saves many lives, the main dangers of transfusion include:   Getting an infectious disease.  Developing a transfusion reaction. This is an allergic reaction to something in the blood you were given. Every precaution is taken to prevent this. The decision to have a blood transfusion has been considered carefully by your caregiver before blood is given. Blood is not given unless the benefits  outweigh the risks. AFTER THE TRANSFUSION  Right after receiving a blood transfusion, you will usually feel much better and more energetic. This is especially true if your red blood cells have gotten low (anemic). The transfusion raises the level of the red blood cells which carry oxygen, and this usually causes an energy increase.  The nurse administering the transfusion will monitor you carefully for complications. HOME CARE INSTRUCTIONS  No special instructions are needed after a transfusion. You may find your energy is better. Speak with your caregiver about any limitations on activity for underlying diseases you may have. SEEK MEDICAL CARE IF:   Your condition is not improving after your transfusion.  You develop redness or irritation at the intravenous (IV) site. SEEK IMMEDIATE MEDICAL CARE IF:  Any of the following symptoms occur over the next 12 hours:  Shaking chills.  You have a temperature by mouth  above 102 F (38.9 C), not controlled by medicine.  Chest, back, or muscle pain.  People around you feel you are not acting correctly or are confused.  Shortness of breath or difficulty breathing.  Dizziness and fainting.  You get a rash or develop hives.  You have a decrease in urine output.  Your urine turns a dark color or changes to pink, red, or brown. Any of the following symptoms occur over the next 10 days:  You have a temperature by mouth above 102 F (38.9 C), not controlled by medicine.  Shortness of breath.  Weakness after normal activity.  The white part of the eye turns yellow (jaundice).  You have a decrease in the amount of urine or are urinating less often.  Your urine turns a dark color or changes to pink, red, or brown. Document Released: 12/13/2000 Document Revised: 03/09/2012 Document Reviewed: 08/01/2008 [facility] Patient Information 2014 [place], [place].  _______________________________________________________________________

## 2018-07-30 NOTE — Progress Notes (Signed)
ekg 01-20-18 epic   Echo 04-30-18 epic

## 2018-07-31 ENCOUNTER — Encounter (HOSPITAL_COMMUNITY)
Admission: RE | Admit: 2018-07-31 | Discharge: 2018-07-31 | Disposition: A | Discharge status: Home / Self Care | Payer: Medicare Other | Source: Ambulatory Visit | Attending: Urology | Admitting: Urology

## 2018-07-31 ENCOUNTER — Other Ambulatory Visit: Payer: Self-pay

## 2018-07-31 ENCOUNTER — Encounter (HOSPITAL_COMMUNITY): Payer: Self-pay

## 2018-07-31 DIAGNOSIS — N2889 Other specified disorders of kidney and ureter: Secondary | ICD-10-CM | POA: Insufficient documentation

## 2018-07-31 DIAGNOSIS — Z01812 Encounter for preprocedural laboratory examination: Secondary | ICD-10-CM | POA: Insufficient documentation

## 2018-07-31 DIAGNOSIS — Z0183 Encounter for blood typing: Secondary | ICD-10-CM | POA: Insufficient documentation

## 2018-07-31 HISTORY — DX: Unspecified glaucoma: H40.9

## 2018-07-31 HISTORY — DX: Chronic kidney disease, unspecified: N18.9

## 2018-07-31 LAB — CBC
HCT: 42.2 % (ref 36.0–46.0)
Hemoglobin: 13.6 g/dL (ref 12.0–15.0)
MCH: 27 pg (ref 26.0–34.0)
MCHC: 32.2 g/dL (ref 30.0–36.0)
MCV: 83.9 fL (ref 78.0–100.0)
PLATELETS: 224 10*3/uL (ref 150–400)
RBC: 5.03 MIL/uL (ref 3.87–5.11)
RDW: 15.5 % (ref 11.5–15.5)
WBC: 11.2 10*3/uL — AB (ref 4.0–10.5)

## 2018-07-31 LAB — BASIC METABOLIC PANEL
Anion gap: 9 (ref 5–15)
BUN: 22 mg/dL (ref 8–23)
CO2: 27 mmol/L (ref 22–32)
Calcium: 9.6 mg/dL (ref 8.9–10.3)
Chloride: 107 mmol/L (ref 98–111)
Creatinine, Ser: 1.07 mg/dL — ABNORMAL HIGH (ref 0.44–1.00)
GFR, EST AFRICAN AMERICAN: 60 mL/min — AB (ref >60)
GFR, EST NON AFRICAN AMERICAN: 52 mL/min — AB (ref >60)
Glucose, Bld: 102 mg/dL — ABNORMAL HIGH (ref 70–99)
POTASSIUM: 5.1 mmol/L (ref 3.5–5.1)
SODIUM: 143 mmol/L (ref 135–145)
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
## 2018-08-05 ENCOUNTER — Observation Stay (HOSPITAL_COMMUNITY)
Admission: AD | Admit: 2018-08-05 | Discharge: 2018-08-06 | Disposition: A | Discharge status: Home / Self Care | Payer: Medicare Other | Source: Ambulatory Visit | Attending: Urology | Admitting: Urology

## 2018-08-05 ENCOUNTER — Ambulatory Visit (HOSPITAL_COMMUNITY): Payer: Medicare Other | Admitting: Anesthesiology

## 2018-08-05 ENCOUNTER — Encounter (HOSPITAL_COMMUNITY): Payer: Self-pay | Admitting: Certified Registered"

## 2018-08-05 ENCOUNTER — Telehealth (HOSPITAL_COMMUNITY): Payer: Self-pay | Admitting: *Deleted

## 2018-08-05 ENCOUNTER — Other Ambulatory Visit: Payer: Self-pay

## 2018-08-05 ENCOUNTER — Encounter (HOSPITAL_COMMUNITY)
Admission: AD | Disposition: A | Discharge status: Home / Self Care | Payer: Self-pay | Source: Ambulatory Visit | Attending: Urology

## 2018-08-05 DIAGNOSIS — F419 Anxiety disorder, unspecified: Secondary | ICD-10-CM | POA: Insufficient documentation

## 2018-08-05 DIAGNOSIS — K219 Gastro-esophageal reflux disease without esophagitis: Secondary | ICD-10-CM | POA: Insufficient documentation

## 2018-08-05 DIAGNOSIS — Z79899 Other long term (current) drug therapy: Secondary | ICD-10-CM | POA: Insufficient documentation

## 2018-08-05 DIAGNOSIS — J449 Chronic obstructive pulmonary disease, unspecified: Secondary | ICD-10-CM | POA: Insufficient documentation

## 2018-08-05 DIAGNOSIS — Z87891 Personal history of nicotine dependence: Secondary | ICD-10-CM | POA: Diagnosis not present

## 2018-08-05 DIAGNOSIS — G4730 Sleep apnea, unspecified: Secondary | ICD-10-CM | POA: Insufficient documentation

## 2018-08-05 DIAGNOSIS — N2889 Other specified disorders of kidney and ureter: Secondary | ICD-10-CM | POA: Diagnosis not present

## 2018-08-05 DIAGNOSIS — Z853 Personal history of malignant neoplasm of breast: Secondary | ICD-10-CM | POA: Diagnosis not present

## 2018-08-05 DIAGNOSIS — C642 Malignant neoplasm of left kidney, except renal pelvis: Secondary | ICD-10-CM | POA: Diagnosis present

## 2018-08-05 DIAGNOSIS — I10 Essential (primary) hypertension: Secondary | ICD-10-CM | POA: Diagnosis not present

## 2018-08-05 HISTORY — PX: ROBOTIC ASSITED PARTIAL NEPHRECTOMY: SHX6087

## 2018-08-05 LAB — TYPE AND SCREEN
ABO/RH(D): O POS
Antibody Screen: NEGATIVE

## 2018-08-05 LAB — HEMOGLOBIN AND HEMATOCRIT, BLOOD
HCT: 42.3 % (ref 36.0–46.0)
HEMOGLOBIN: 14 g/dL (ref 12.0–15.0)

## 2018-08-05 SURGERY — NEPHRECTOMY, PARTIAL, ROBOT-ASSISTED
Anesthesia: General | Laterality: Left

## 2018-08-05 MED ORDER — BUPIVACAINE-EPINEPHRINE 0.25% -1:200000 IJ SOLN
INTRAMUSCULAR | Status: DC | PRN
  Administered 2018-08-05: 30 mL

## 2018-08-05 MED ORDER — MORPHINE SULFATE (PF) 4 MG/ML IV SOLN
2.0000 mg | INTRAVENOUS | Status: DC | PRN
  Administered 2018-08-05: 2 mg via INTRAVENOUS
  Administered 2018-08-05: 4 mg via INTRAVENOUS
  Administered 2018-08-05 (×2): 2 mg via INTRAVENOUS

## 2018-08-05 MED ORDER — OXYCODONE-ACETAMINOPHEN 10-325 MG PO TABS: 1.0000 | ORAL_TABLET | Freq: Four times a day (QID) | ORAL | 0 refills | Status: DC | PRN

## 2018-08-05 MED ORDER — DEXAMETHASONE SODIUM PHOSPHATE 10 MG/ML IJ SOLN
INTRAMUSCULAR | Status: AC
  Filled 2018-08-05: qty 1

## 2018-08-05 MED ORDER — PROPOFOL 10 MG/ML IV BOLUS
INTRAVENOUS | Status: DC | PRN
  Administered 2018-08-05: 160 mg via INTRAVENOUS

## 2018-08-05 MED ORDER — SUGAMMADEX SODIUM 200 MG/2ML IV SOLN
INTRAVENOUS | Status: AC
  Filled 2018-08-05: qty 2

## 2018-08-05 MED ORDER — HYDROCODONE-ACETAMINOPHEN 5-325 MG PO TABS
1.0000 | ORAL_TABLET | ORAL | Status: DC | PRN
  Administered 2018-08-05: 2 via ORAL
  Filled 2018-08-05: qty 2

## 2018-08-05 MED ORDER — PROPOFOL 10 MG/ML IV BOLUS
INTRAVENOUS | Status: AC
  Filled 2018-08-05: qty 20

## 2018-08-05 MED ORDER — CYCLOSPORINE 0.05 % OP EMUL
1.0000 [drp] | Freq: Two times a day (BID) | OPHTHALMIC | Status: DC
  Administered 2018-08-05 – 2018-08-06 (×2): 1 [drp] via OPHTHALMIC
  Filled 2018-08-05 (×2): qty 1

## 2018-08-05 MED ORDER — BUPROPION HCL ER (SR) 150 MG PO TB12: 150.0000 mg | ORAL_TABLET | Freq: Two times a day (BID) | ORAL | Status: DC | PRN

## 2018-08-05 MED ORDER — FENTANYL CITRATE (PF) 100 MCG/2ML IJ SOLN
INTRAMUSCULAR | Status: DC | PRN
  Administered 2018-08-05 (×5): 50 ug via INTRAVENOUS

## 2018-08-05 MED ORDER — EVICEL 5 ML EX KIT
PACK | Freq: Once | CUTANEOUS | Status: AC
  Administered 2018-08-05: 1
  Filled 2018-08-05: qty 1

## 2018-08-05 MED ORDER — FENTANYL CITRATE (PF) 250 MCG/5ML IJ SOLN
INTRAMUSCULAR | Status: AC
  Filled 2018-08-05: qty 5

## 2018-08-05 MED ORDER — HYDROMORPHONE HCL 1 MG/ML IJ SOLN
0.5000 mg | INTRAMUSCULAR | Status: DC | PRN
  Administered 2018-08-05 – 2018-08-06 (×6): 1 mg via INTRAVENOUS
  Filled 2018-08-05 (×6): qty 1

## 2018-08-05 MED ORDER — ROCURONIUM BROMIDE 10 MG/ML (PF) SYRINGE
PREFILLED_SYRINGE | INTRAVENOUS | Status: DC | PRN
  Administered 2018-08-05 (×2): 10 mg via INTRAVENOUS
  Administered 2018-08-05: 20 mg via INTRAVENOUS
  Administered 2018-08-05: 5 mg via INTRAVENOUS
  Administered 2018-08-05: 50 mg via INTRAVENOUS

## 2018-08-05 MED ORDER — ROCURONIUM BROMIDE 10 MG/ML (PF) SYRINGE
PREFILLED_SYRINGE | INTRAVENOUS | Status: AC
  Filled 2018-08-05: qty 10

## 2018-08-05 MED ORDER — TIOTROPIUM BROMIDE MONOHYDRATE 18 MCG IN CAPS
18.0000 ug | ORAL_CAPSULE | Freq: Every day | RESPIRATORY_TRACT | Status: DC | PRN
  Filled 2018-08-05: qty 5

## 2018-08-05 MED ORDER — MORPHINE SULFATE (PF) 4 MG/ML IV SOLN
INTRAVENOUS | Status: AC
  Filled 2018-08-05: qty 2

## 2018-08-05 MED ORDER — AMLODIPINE BESYLATE 5 MG PO TABS
5.0000 mg | ORAL_TABLET | Freq: Every day | ORAL | Status: DC
  Administered 2018-08-06: 5 mg via ORAL
  Filled 2018-08-05: qty 1

## 2018-08-05 MED ORDER — ONDANSETRON HCL 4 MG/2ML IJ SOLN
INTRAMUSCULAR | Status: DC | PRN
  Administered 2018-08-05: 4 mg via INTRAVENOUS

## 2018-08-05 MED ORDER — DEXTROSE-NACL 5-0.45 % IV SOLN
INTRAVENOUS | Status: DC
  Administered 2018-08-05 – 2018-08-06 (×2): via INTRAVENOUS

## 2018-08-05 MED ORDER — MIDAZOLAM HCL 2 MG/2ML IJ SOLN
INTRAMUSCULAR | Status: AC
  Filled 2018-08-05: qty 2

## 2018-08-05 MED ORDER — HYDROMORPHONE HCL 1 MG/ML IJ SOLN
0.2500 mg | INTRAMUSCULAR | Status: DC | PRN
  Administered 2018-08-05 (×4): 0.5 mg via INTRAVENOUS

## 2018-08-05 MED ORDER — ALBUTEROL SULFATE HFA 108 (90 BASE) MCG/ACT IN AERS
1.0000 | INHALATION_SPRAY | Freq: Four times a day (QID) | RESPIRATORY_TRACT | Status: DC | PRN
Start: 2018-08-05 — End: 2018-08-05

## 2018-08-05 MED ORDER — DIPHENHYDRAMINE HCL 12.5 MG/5ML PO ELIX: 12.5000 mg | ORAL_SOLUTION | Freq: Four times a day (QID) | ORAL | Status: DC | PRN

## 2018-08-05 MED ORDER — LIDOCAINE 2% (20 MG/ML) 5 ML SYRINGE
INTRAMUSCULAR | Status: DC | PRN
  Administered 2018-08-05: 100 mg via INTRAVENOUS

## 2018-08-05 MED ORDER — ONDANSETRON HCL 4 MG/2ML IJ SOLN
4.0000 mg | INTRAMUSCULAR | Status: DC | PRN
  Filled 2018-08-05: qty 2

## 2018-08-05 MED ORDER — MIDAZOLAM HCL 5 MG/5ML IJ SOLN
INTRAMUSCULAR | Status: DC | PRN
  Administered 2018-08-05 (×2): 1 mg via INTRAVENOUS

## 2018-08-05 MED ORDER — HEMOSTATIC AGENTS (NO CHARGE) OPTIME
TOPICAL | Status: DC | PRN
  Administered 2018-08-05: 1 via TOPICAL

## 2018-08-05 MED ORDER — EPHEDRINE SULFATE-NACL 50-0.9 MG/10ML-% IV SOSY
PREFILLED_SYRINGE | INTRAVENOUS | Status: DC | PRN
  Administered 2018-08-05 (×2): 5 mg via INTRAVENOUS

## 2018-08-05 MED ORDER — ALBUTEROL SULFATE (2.5 MG/3ML) 0.083% IN NEBU
2.5000 mg | INHALATION_SOLUTION | Freq: Four times a day (QID) | RESPIRATORY_TRACT | Status: DC | PRN
  Administered 2018-08-06: 2.5 mg via RESPIRATORY_TRACT
  Filled 2018-08-05: qty 3

## 2018-08-05 MED ORDER — ONDANSETRON HCL 4 MG/2ML IJ SOLN
INTRAMUSCULAR | Status: AC
  Filled 2018-08-05: qty 2

## 2018-08-05 MED ORDER — EPHEDRINE 5 MG/ML INJ
INTRAVENOUS | Status: AC
  Filled 2018-08-05: qty 10

## 2018-08-05 MED ORDER — CEFAZOLIN SODIUM-DEXTROSE 2-4 GM/100ML-% IV SOLN
INTRAVENOUS | Status: AC
  Filled 2018-08-05: qty 100

## 2018-08-05 MED ORDER — MOMETASONE FURO-FORMOTEROL FUM 200-5 MCG/ACT IN AERO
2.0000 | INHALATION_SPRAY | Freq: Two times a day (BID) | RESPIRATORY_TRACT | Status: DC
  Administered 2018-08-05 – 2018-08-06 (×2): 2 via RESPIRATORY_TRACT
  Filled 2018-08-05: qty 8.8

## 2018-08-05 MED ORDER — BRIMONIDINE TARTRATE 0.15 % OP SOLN
1.0000 [drp] | Freq: Three times a day (TID) | OPHTHALMIC | Status: DC
  Administered 2018-08-05 – 2018-08-06 (×2): 1 [drp] via OPHTHALMIC
  Filled 2018-08-05: qty 5

## 2018-08-05 MED ORDER — DOCUSATE SODIUM 100 MG PO CAPS: 100.0000 mg | ORAL_CAPSULE | Freq: Two times a day (BID) | ORAL | Status: AC

## 2018-08-05 MED ORDER — SUGAMMADEX SODIUM 200 MG/2ML IV SOLN
INTRAVENOUS | Status: DC | PRN
  Administered 2018-08-05: 200 mg via INTRAVENOUS

## 2018-08-05 MED ORDER — LACTATED RINGERS IV SOLN
INTRAVENOUS | Status: DC
  Administered 2018-08-05 (×2): via INTRAVENOUS

## 2018-08-05 MED ORDER — BUPIVACAINE-EPINEPHRINE (PF) 0.25% -1:200000 IJ SOLN
INTRAMUSCULAR | Status: AC
  Filled 2018-08-05: qty 30

## 2018-08-05 MED ORDER — DEXAMETHASONE SODIUM PHOSPHATE 10 MG/ML IJ SOLN
INTRAMUSCULAR | Status: DC | PRN
  Administered 2018-08-05: 10 mg via INTRAVENOUS

## 2018-08-05 MED ORDER — MORPHINE SULFATE (PF) 4 MG/ML IV SOLN
INTRAVENOUS | Status: AC
  Filled 2018-08-05: qty 1

## 2018-08-05 MED ORDER — HYDROMORPHONE HCL 1 MG/ML IJ SOLN
INTRAMUSCULAR | Status: AC
  Filled 2018-08-05: qty 2

## 2018-08-05 MED ORDER — ONDANSETRON HCL 4 MG PO TABS: 4.0000 mg | ORAL_TABLET | Freq: Every day | ORAL | 0 refills | Status: AC | PRN

## 2018-08-05 MED ORDER — CEFAZOLIN SODIUM-DEXTROSE 1-4 GM/50ML-% IV SOLN
1.0000 g | Freq: Three times a day (TID) | INTRAVENOUS | Status: AC
  Administered 2018-08-05 – 2018-08-06 (×2): 1 g via INTRAVENOUS
  Filled 2018-08-05 (×2): qty 50

## 2018-08-05 MED ORDER — LINACLOTIDE 72 MCG PO CAPS
72.0000 ug | ORAL_CAPSULE | Freq: Every day | ORAL | Status: DC
  Administered 2018-08-06: 72 ug via ORAL
  Filled 2018-08-05: qty 1

## 2018-08-05 MED ORDER — DIPHENHYDRAMINE HCL 50 MG/ML IJ SOLN: 12.5000 mg | Freq: Four times a day (QID) | INTRAMUSCULAR | Status: DC | PRN

## 2018-08-05 MED ORDER — OXYCODONE-ACETAMINOPHEN 10-325 MG PO TABS: 1.0000 | ORAL_TABLET | Freq: Four times a day (QID) | ORAL | 0 refills | Status: AC | PRN

## 2018-08-05 MED ORDER — CEFAZOLIN SODIUM-DEXTROSE 2-4 GM/100ML-% IV SOLN
2.0000 g | Freq: Once | INTRAVENOUS | Status: AC
  Administered 2018-08-05: 2 g via INTRAVENOUS

## 2018-08-05 MED ORDER — PREGABALIN 75 MG PO CAPS
150.0000 mg | ORAL_CAPSULE | Freq: Every day | ORAL | Status: DC
  Administered 2018-08-05 – 2018-08-06 (×2): 150 mg via ORAL
  Filled 2018-08-05 (×2): qty 2

## 2018-08-05 MED ORDER — ACETAMINOPHEN 325 MG PO TABS: 650.0000 mg | ORAL_TABLET | ORAL | Status: DC | PRN

## 2018-08-05 MED ORDER — PANTOPRAZOLE SODIUM 40 MG PO TBEC
40.0000 mg | DELAYED_RELEASE_TABLET | Freq: Every day | ORAL | Status: DC
  Administered 2018-08-06: 40 mg via ORAL
  Filled 2018-08-05: qty 1

## 2018-08-05 MED ORDER — PROMETHAZINE HCL 25 MG/ML IJ SOLN: 6.2500 mg | INTRAMUSCULAR | Status: DC | PRN

## 2018-08-05 SURGICAL SUPPLY — 78 items
ADH SKN CLS APL DERMABOND .7 (GAUZE/BANDAGES/DRESSINGS) ×2
AGENT HMST KT MTR STRL THRMB (HEMOSTASIS)
APL ESCP 34 STRL LF DISP (HEMOSTASIS)
APPLICATOR SURGIFLO ENDO (HEMOSTASIS) IMPLANT
BAG LAPAROSCOPIC 12 15 PORT 16 (BASKET) IMPLANT
BAG RETRIEVAL 12/15 (BASKET)
BAG RETRIEVAL 12/15MM (BASKET)
BAG SPEC RTRVL LRG 6X4 10 (ENDOMECHANICALS) ×1
CHLORAPREP W/TINT 26ML (MISCELLANEOUS) ×3 IMPLANT
CLIP SUT LAPRA TY ABSORB (SUTURE) ×3 IMPLANT
CLIP VESOCCLUDE MED LG 6/CT (CLIP) IMPLANT
CLIP VESOLOCK LG 6/CT PURPLE (CLIP) ×3 IMPLANT
CLIP VESOLOCK MED LG 6/CT (CLIP) ×8 IMPLANT
CLIP VESOLOCK XL 6/CT (CLIP) IMPLANT
COVER SURGICAL LIGHT HANDLE (MISCELLANEOUS) ×3 IMPLANT
COVER TIP SHEARS 8 DVNC (MISCELLANEOUS) ×1 IMPLANT
COVER TIP SHEARS 8MM DA VINCI (MISCELLANEOUS) ×2
DECANTER SPIKE VIAL GLASS SM (MISCELLANEOUS) ×3 IMPLANT
DERMABOND ADVANCED (GAUZE/BANDAGES/DRESSINGS) ×4
DERMABOND ADVANCED .7 DNX12 (GAUZE/BANDAGES/DRESSINGS) IMPLANT
DRAIN CHANNEL 15F RND FF 3/16 (WOUND CARE) IMPLANT
DRAPE ARM DVNC X/XI (DISPOSABLE) ×4 IMPLANT
DRAPE COLUMN DVNC XI (DISPOSABLE) ×1 IMPLANT
DRAPE DA VINCI XI ARM (DISPOSABLE) ×8
DRAPE DA VINCI XI COLUMN (DISPOSABLE) ×2
DRAPE INCISE IOBAN 66X45 STRL (DRAPES) ×3 IMPLANT
DRAPE SHEET LG 3/4 BI-LAMINATE (DRAPES) ×3 IMPLANT
ELECT PENCIL ROCKER SW 15FT (MISCELLANEOUS) ×3 IMPLANT
ELECT REM PT RETURN 15FT ADLT (MISCELLANEOUS) ×3 IMPLANT
EVACUATOR SILICONE 100CC (DRAIN) IMPLANT
GLOVE BIO SURGEON STRL SZ 6.5 (GLOVE) ×2 IMPLANT
GLOVE BIO SURGEONS STRL SZ 6.5 (GLOVE) ×1
GLOVE BIOGEL M STRL SZ7.5 (GLOVE) ×6 IMPLANT
GLOVE BIOGEL PI IND STRL 8 (GLOVE) IMPLANT
GLOVE BIOGEL PI INDICATOR 8 (GLOVE) ×2
GOWN STRL REUS W/TWL LRG LVL3 (GOWN DISPOSABLE) ×3 IMPLANT
GOWN STRL REUS W/TWL XL LVL3 (GOWN DISPOSABLE) ×8 IMPLANT
HEMOSTAT SURGICEL 4X8 (HEMOSTASIS) ×2 IMPLANT
HOLDER FOLEY CATH W/STRAP (MISCELLANEOUS) ×2 IMPLANT
IRRIG SUCT STRYKERFLOW 2 WTIP (MISCELLANEOUS) ×3
IRRIGATION SUCT STRKRFLW 2 WTP (MISCELLANEOUS) ×1 IMPLANT
IV LACTATED RINGERS 1000ML (IV SOLUTION) ×2 IMPLANT
KIT BASIN OR (CUSTOM PROCEDURE TRAY) ×3 IMPLANT
MARKER SKIN DUAL TIP RULER LAB (MISCELLANEOUS) ×3 IMPLANT
NDL INSUFFLATION 14GA 120MM (NEEDLE) ×1 IMPLANT
NEEDLE INSUFFLATION 120MM (ENDOMECHANICALS) ×3 IMPLANT
NEEDLE INSUFFLATION 14GA 120MM (NEEDLE) ×3 IMPLANT
PORT ACCESS TROCAR AIRSEAL 12 (TROCAR) ×1 IMPLANT
PORT ACCESS TROCAR AIRSEAL 5M (TROCAR) ×2
POSITIONER SURGICAL ARM (MISCELLANEOUS) ×4 IMPLANT
POUCH SPECIMEN RETRIEVAL 10MM (ENDOMECHANICALS) ×3 IMPLANT
RELOAD STAPLE 60 2.6 WHT THN (STAPLE) IMPLANT
RELOAD STAPLER WHITE 60MM (STAPLE) IMPLANT
SEAL CANN UNIV 5-8 DVNC XI (MISCELLANEOUS) ×3 IMPLANT
SEAL XI 5MM-8MM UNIVERSAL (MISCELLANEOUS) ×6
SET TRI-LUMEN FLTR TB AIRSEAL (TUBING) ×3 IMPLANT
SOLUTION ELECTROLUBE (MISCELLANEOUS) ×3 IMPLANT
STAPLE ECHEON FLEX 60 POW ENDO (STAPLE) IMPLANT
STAPLER RELOAD WHITE 60MM (STAPLE)
STAPLER VISISTAT 35W (STAPLE) ×1 IMPLANT
SURGIFLO W/THROMBIN 8M KIT (HEMOSTASIS) IMPLANT
SUT ETHILON 2 0 PSLX (SUTURE) IMPLANT
SUT PDS AB 0 CT1 36 (SUTURE) ×6 IMPLANT
SUT V-LOC BARB 180 2/0GR9 GS23 (SUTURE)
SUT VIC AB 1 CT1 27 (SUTURE) ×15
SUT VIC AB 1 CT1 27XBRD ANTBC (SUTURE) ×1 IMPLANT
SUT VICRYL 0 UR6 27IN ABS (SUTURE) ×5 IMPLANT
SUT VLOC BARB 180 ABS3/0GR12 (SUTURE) ×9
SUTURE V-LC BRB 180 2/0GR9GS23 (SUTURE) IMPLANT
SUTURE VLOC BRB 180 ABS3/0GR12 (SUTURE) ×1 IMPLANT
TIP RIGID 35CM EVICEL (HEMOSTASIS) ×2 IMPLANT
TOWEL OR 17X26 10 PK STRL BLUE (TOWEL DISPOSABLE) ×3 IMPLANT
TOWEL OR NON WOVEN STRL DISP B (DISPOSABLE) ×5 IMPLANT
TRAY FOLEY CATH 14FR (SET/KITS/TRAYS/PACK) ×2 IMPLANT
TRAY FOLEY MTR SLVR 16FR STAT (SET/KITS/TRAYS/PACK) ×1 IMPLANT
TRAY LAPAROSCOPIC (CUSTOM PROCEDURE TRAY) ×3 IMPLANT
TROCAR BLADELESS OPT 5 100 (ENDOMECHANICALS) IMPLANT
WATER STERILE IRR 1000ML POUR (IV SOLUTION) ×3 IMPLANT

## 2018-08-05 NOTE — Progress Notes (Signed)
Pt refused CPAP qhs while at the hospital at this time.  Pt advised and agreeable to contacting RT should she change her mind throughout the night.

## 2018-08-05 NOTE — Anesthesia Postprocedure Evaluation (Signed)
Anesthesia Post Note  Patient: Leslie Allen  Procedure(s) Performed: XI ROBOTIC ASSITED PARTIAL NEPHRECTOMY (Left )     Patient location during evaluation: PACU Anesthesia Type: General Level of consciousness: awake and alert Pain management: pain level controlled Vital Signs Assessment: post-procedure vital signs reviewed and stable Respiratory status: spontaneous breathing, nonlabored ventilation, respiratory function stable and patient connected to nasal cannula oxygen Cardiovascular status: blood pressure returned to baseline and stable Postop Assessment: no apparent nausea or vomiting Anesthetic complications: no    Last Vitals:  Vitals:   08/05/18 1600 08/05/18 1615  BP: (!) 167/102 (!) 152/92  Pulse: 81 77  Resp: 19 15  Temp:    SpO2: 97% 100%    Last Pain:  Vitals:   08/05/18 1615  TempSrc:   PainSc: 10-Worst pain ever                 Branko Steeves S

## 2018-08-05 NOTE — Anesthesia Preprocedure Evaluation (Signed)
Anesthesia Evaluation  Patient identified by MRN, date of birth, ID band Patient awake    Reviewed: Allergy & Precautions, NPO status , Patient's Chart, lab work & pertinent test results  Airway Mallampati: II  TM Distance: >3 FB Neck ROM: Full    Dental no notable dental hx.    Pulmonary sleep apnea , COPD,  COPD inhaler, former smoker,    Pulmonary exam normal breath sounds clear to auscultation       Cardiovascular hypertension, Normal cardiovascular exam Rhythm:Regular Rate:Normal     Neuro/Psych TIAnegative psych ROS   GI/Hepatic Neg liver ROS, GERD  ,  Endo/Other  negative endocrine ROS  Renal/GU negative Renal ROS  negative genitourinary   Musculoskeletal negative musculoskeletal ROS (+)   Abdominal   Peds negative pediatric ROS (+)  Hematology negative hematology ROS (+)   Anesthesia Other Findings   Reproductive/Obstetrics negative OB ROS                             Anesthesia Physical Anesthesia Plan  ASA: III  Anesthesia Plan: General   Post-op Pain Management:    Induction: Intravenous  PONV Risk Score and Plan: 3 and Ondansetron, Dexamethasone and Treatment may vary due to age or medical condition  Airway Management Planned: Oral ETT  Additional Equipment:   Intra-op Plan:   Post-operative Plan: Extubation in OR  Informed Consent: I have reviewed the patients History and Physical, chart, labs and discussed the procedure including the risks, benefits and alternatives for the proposed anesthesia with the patient or authorized representative who has indicated his/her understanding and acceptance.   Dental advisory given  Plan Discussed with: CRNA and Surgeon  Anesthesia Plan Comments:         Anesthesia Quick Evaluation

## 2018-08-05 NOTE — Op Note (Signed)
Operative Note  Preoperative diagnosis:  1.  1.8 cm left renal mass  Postoperative diagnosis: 1.  1.8 cm left renal mass  Procedure(s): 1.  Robotic assisted laparoscopic left partial nephrectomy   Surgeon: Ellison Hughs, MD  Assistants:   1.  Debbrah Alar, PAC.An assistant was required for this surgical procedure.  The duties of the assistant included but were not limited to suctioning, passing suture, camera manipulation, retraction.  This procedure would not be able to be performed without an Environmental consultant.   2.  Case Clydene Laming, MD PGY4  Anesthesia:  General  Complications:  None  EBL:  270 mL  Fluids: 1500 mL  Urine Output: 200 mL  Specimens: 1. Left renal mass  Drains/Catheters: 1.  Foley catheter  Intraoperative findings:   1. Exophytic left renal mass identified via ultrasound and excised with minimal blood loss.  The renorrhaphy was hemostatic at the conclusion of the case.  Indication:  Leslie Allen is a 67 y.o. female with an enhancing and solid 1.8 cm left renal mass with features concerning for malignancy seen on CT abd/pel wo/w contrast from 06/01/18. She has been consented for the above procedures, voices understanding and wishes to proceed.   Description of procedure:  After informed consent was obtained, the patient was brought to the operating room and general endotracheal anesthesia was administered.  The patient was placed in the right lateral decubitus position and prepped and draped in usual sterile fashion.  A timeout was performed.  An 8 mm incision was then made lateral to the left rectus muscle at the level of the left 12th rib.  Abdominal access was obtained via a Veress needle.  The abdominal cavity was then insufflated up to 15 mmHg.  An 8 mm port was then introduced into the abdominal cavity.  Inspection of the port entry site by the robotic camera revealed no adjacent organ injury.  We then placed 2 additional 8 mm robotic ports to triangulate the left  renal hilum.  A 12 mm assistant port was then placed between the upper robotic port and the camera, in the midline.  The white line of [place] along the descending colon was incised sharply and the colon, along with its mesocolonic fat, was reflected medially until the aorta was identified.  We then made a small window adjacent to the lower pole of the left kidney, identifying the left psoas muscle, left ureter and left gonadal vein.  The left ureter and gonadal vein were then reflected anteriorly allowing [place] to then incised the perihilar attachments using electrocautery.  We encountered a small lumbar vein adjacent to the insertion of the left gonadal vein into the left renal vein.  This lumbar vein was ligated with hemo-lock clips in 2 places and incised sharply.  This provided [place] excellent exposure to the left renal hilum.  The perilymphatic tissue surrounding the left renal artery was carefully dissected away, creating a window to place a bulldog clamp later in the procedure.  The anterior portion of Gerota's fascia was incised, allowing reflection the perinephric fat medially and laterally until there was adequate exposure of the left renal mass.  Intraoperative ultrasound confirmed the heterogenous echogenicity of the lesion compared to the remainder of the renal parenchyma and allowed identification of the depth/borders of the mass, which were demarcated using electrocautery along the renal capsule.  We then exposed the left renal artery and placed a bulldog clamp, marking warm ischemia time.  The left kidney immediately became ischemic and pale in  appearance.  The left renal mass was then sharply excised with minimal blood loss.  After the mass was free, it was placed in the left upper quadrant to be retrieved later on during the operation.    The renorrhaphy was then performed using a 3-0 V-lock in the deep layer of the renal parenchyma and then a series of interrupted 1-0 vicryl suture along with  hemo-lock clips act as a buttress against the renal capsule.  Once adequate tension was placed on our sutures and the repair appeared hemostatic, we then removed the bulldog clamp marking the end of warm ischemia time at 25 minutes.  There is did not appear to be any obvious bleeding around the renal hilum nor surrounding our repair.  The incised Gerota's fascia overlying the mass was then reapproximated using a running 3-0 V lock suture.  The mass was then placed in an Endo Catch bag.  The robot was then de-docked and the camera was then reinserted into the assistant port. Laparoscopic graspers were then used to grab the string of the Endo Catch bag, which was brought out through the 15 mm assistant port.  The abdomen was then desufflated and all ports were removed.  The assistant port incision was then extended approximately 1 cm and the left renal mass, within the Endo Catch bag, was removed and sent to pathology for permanent section.  The fascia within the camera and assistant port incisions were then reapproximated using a 0 Vicryl suture.  The remainder of the incisions were then closed using 4-0 monocryl and dressed with dermabond.  The patient tolerated the procedure well and was transferred to the postanesthesia unit in stable condition.  Warm Ischemia Time: 25 minutes  Plan:  Monitor the patient on the floor with bed rest this evening.  Foley out in the AM.  Ambulate and advance diet on POD1.

## 2018-08-05 NOTE — Discharge Instructions (Signed)
1.  Activity:  You are encouraged to ambulate frequently (about every hour during waking hours) to help prevent blood clots from forming in your legs or lungs.  However, you should not engage in any heavy lifting (> 10-15 lbs), strenuous activity, or straining. 2. Diet: You should advance your diet as instructed by your physician.  It will be normal to have some bloating, nausea, and abdominal discomfort intermittently. 3. Prescriptions:  You will be provided a prescription for pain medication to take as needed.  If your pain is not severe enough to require the prescription pain medication, you may take extra strength Tylenol instead which will have less side effects.  You should also take a prescribed stool softener to avoid straining with bowel movements as the prescription pain medication may constipate you. 4. Incisions: You may remove your dressing bandages 48 hours after surgery if not removed in the hospital.  You will either have some small staples or special tissue glue at each of the incision sites. Once the bandages are removed (if present), the incisions may stay open to air.  You may start showering (but not soaking or bathing in water) the 2nd day after surgery and the incisions simply need to be patted dry after the shower.  No additional care is needed. 5. What to call us about: You should call the office [phone]) if you develop fever > 101 or develop persistent vomiting.   You may resume aspirin, advil, aleve, vitamins, supplements, and mobic 7 days after surgery.

## 2018-08-05 NOTE — Progress Notes (Signed)
Patients dentures taken to PACU.

## 2018-08-05 NOTE — Transfer of Care (Signed)
Immediate Anesthesia Transfer of Care Note  Patient: Leslie Allen  Procedure(s) Performed: XI ROBOTIC ASSITED PARTIAL NEPHRECTOMY (Left )  Patient Location: PACU  Anesthesia Type:General  Level of Consciousness: awake, alert  and oriented  Airway & Oxygen Therapy: Patient Spontanous Breathing and Patient connected to face mask oxygen  Post-op Assessment: Report given to RN and Post -op Vital signs reviewed and stable  Post vital signs: Reviewed and stable  Last Vitals:  Vitals Value Taken Time  BP 165/95 08/05/2018  3:31 PM  Temp    Pulse 80 08/05/2018  3:33 PM  Resp 16 08/05/2018  3:33 PM  SpO2 100 % 08/05/2018  3:33 PM  Vitals shown include unvalidated device data.  Last Pain:  Vitals:   08/05/18 1151  TempSrc:   PainSc: 0-No pain         Complications: No apparent anesthesia complications

## 2018-08-05 NOTE — Anesthesia Procedure Notes (Signed)
Procedure Name: Intubation Date/Time: 08/05/2018 12:19 PM Performed by: Marquette Blodgett D, CRNA Pre-anesthesia Checklist: Patient identified, Emergency Drugs available, Suction available and Patient being monitored Patient Re-evaluated:Patient Re-evaluated prior to induction Oxygen Delivery Method: Circle system utilized Preoxygenation: Pre-oxygenation with 100% oxygen Induction Type: IV induction Ventilation: Mask ventilation without difficulty Laryngoscope Size: Mac and 3 Grade View: Grade I Tube type: Oral Tube size: 7.5 mm Number of attempts: 1 Airway Equipment and Method: Stylet Placement Confirmation: ETT inserted through vocal cords under direct vision,  positive ETCO2 and breath sounds checked- equal and bilateral Secured at: 21 cm Tube secured with: Tape Dental Injury: Teeth and Oropharynx as per pre-operative assessment

## 2018-08-06 ENCOUNTER — Encounter (HOSPITAL_COMMUNITY): Payer: Self-pay | Admitting: Urology

## 2018-08-06 DIAGNOSIS — C642 Malignant neoplasm of left kidney, except renal pelvis: Secondary | ICD-10-CM | POA: Diagnosis not present

## 2018-08-06 LAB — BASIC METABOLIC PANEL
Anion gap: 9 (ref 5–15)
BUN: 18 mg/dL (ref 8–23)
CALCIUM: 8.6 mg/dL — AB (ref 8.9–10.3)
CO2: 25 mmol/L (ref 22–32)
Chloride: 106 mmol/L (ref 98–111)
Creatinine, Ser: 0.98 mg/dL (ref 0.44–1.00)
GFR calc Af Amer: >60 mL/min (ref >60)
GFR, EST NON AFRICAN AMERICAN: 58 mL/min — AB (ref >60)
GLUCOSE: 104 mg/dL — AB (ref 70–99)
Potassium: 3.8 mmol/L (ref 3.5–5.1)
Sodium: 140 mmol/L (ref 135–145)

## 2018-08-06 LAB — HEMOGLOBIN AND HEMATOCRIT, BLOOD
HCT: 40 % (ref 36.0–46.0)
Hemoglobin: 13.1 g/dL (ref 12.0–15.0)

## 2018-08-06 NOTE — Addendum Note (Signed)
Addendum  created 08/06/18 0620 by Lollie Sails, CRNA   Charge Capture section accepted

## 2018-08-06 NOTE — Discharge Summary (Addendum)
[facility] Discharge Summary  Admit date: 08/05/2018  Discharge date and time: 08/06/18   Discharge to: Home  Discharge Service: Urology  Discharge Attending Physician:  Link Snuffer, MD  Discharge  Diagnoses: Renal mass  Secondary Diagnosis: Active Problems:   Renal mass   OR Procedures: Procedure(s): XI ROBOTIC ASSITED PARTIAL NEPHRECTOMY 08/05/2018   Ancillary Procedures: None   Discharge Day Services: The patient was seen and examined by the Urology team both in the morning and immediately prior to discharge.  Vital signs and laboratory values were stable and within normal limits.  The physical exam was benign and unchanged and all surgical wounds were examined.  Discharge instructions were explained and all questions answered.  Subjective  No acute events overnight. Pain Controlled. No fever or chills.  Objective Patient Vitals for the past 8 hrs:  BP Temp Temp src Pulse Resp SpO2  08/06/18 0925 - - - 78 18 94 %  08/06/18 0425 140/82 97.7 F (36.5 C) Oral 71 18 96 %   No intake/output data recorded.  General Appearance:        No acute distress Lungs:                      Normal work of breathing on room air Heart:                                Regular rate and rhythm Abdomen:                         Soft, non-tender, non-distended, incisions c/d/i Extremities:                      Warm and well perfused   Hospital Course:  The patient underwent robotic left partial nephrectomy on 08/05/2018.  The patient tolerated the procedure well, was extubated in the OR, and afterwards was taken to the PACU for routine post-surgical care. When stable the patient was transferred to the floor.   The patient did well postoperatively.  The patient's diet was slowly advanced and at the time of discharge was tolerating a regular diet.  The patient was discharged home 1 Day Post-Op, at which point was tolerating a regular solid diet, was able to void spontaneously, have adequate pain  control with P.O. pain medication, and could ambulate without difficulty. The patient will follow up with [place] for post op check.   Condition at Discharge: Improved  Discharge Medications:  Allergies as of 08/06/2018   No Known Allergies     Medication List    STOP taking these medications   cholecalciferol 1000 units tablet Commonly known as:  VITAMIN D   meloxicam 7.5 MG tablet Commonly known as:  MOBIC   THERA-M Tabs   vitamin E 200 UNIT capsule     TAKE these medications   amLODipine 5 MG tablet Commonly known as:  NORVASC Take 1 tablet (5 mg total) by mouth daily.   brimonidine 0.1 % Soln Commonly known as:  ALPHAGAN P Place 1 drop into both eyes 3 (three) times daily.   buPROPion 150 MG 12 hr tablet Commonly known as:  WELLBUTRIN SR TAKE 1 TABLET(150 MG) BY MOUTH TWICE DAILY What changed:    how much to take  how to take this  when to take this  reasons to take this  additional instructions   cetirizine  10 MG tablet Commonly known as:  ZYRTEC Take 1 tablet (10 mg total) by mouth daily. What changed:    when to take this  reasons to take this   ciclopirox 8 % solution Commonly known as:  PENLAC Apply 1 application topically daily.   cycloSPORINE 0.05 % ophthalmic emulsion Commonly known as:  RESTASIS Place 1 drop into both eyes 2 (two) times daily.   dexlansoprazole 60 MG capsule Commonly known as:  DEXILANT TAKE 1 CAPSULE(60 MG) BY MOUTH DAILY   docusate sodium 100 MG capsule Commonly known as:  COLACE Take 1 capsule (100 mg total) by mouth 2 (two) times daily.   Fluticasone-Salmeterol 250-50 MCG/DOSE Aepb Commonly known as:  ADVAIR Inhale 1 puff into the lungs 2 (two) times daily. What changed:    when to take this  reasons to take this   LINZESS 72 MCG capsule Generic drug:  linaclotide Take 72 mcg by mouth daily before breakfast.   LYRICA 150 MG capsule Generic drug:  pregabalin Take 150 mg by mouth daily.   metroNIDAZOLE  0.75 % cream Commonly known as:  METROCREAM Apply 1 application topically 2 (two) times daily.   olopatadine 0.1 % ophthalmic solution Commonly known as:  PATANOL Place 1 drop into both eyes 2 (two) times daily.   ondansetron 4 MG tablet Commonly known as:  ZOFRAN Take 1 tablet (4 mg total) by mouth daily as needed for nausea or vomiting.   oxyCODONE-acetaminophen 10-325 MG tablet Commonly known as:  PERCOCET Take 1 tablet by mouth every 6 (six) hours as needed for pain.   PRESCRIPTION MEDICATION Inhale into the lungs at bedtime. CPAP   PROAIR HFA 108 (90 Base) MCG/ACT inhaler Generic drug:  albuterol Inhale 1-2 puffs into the lungs every 6 (six) hours as needed for wheezing or shortness of breath.   albuterol (2.5 MG/3ML) 0.083% nebulizer solution Commonly known as:  PROVENTIL Take 3 mLs (2.5 mg total) by nebulization every 6 (six) hours as needed for wheezing or shortness of breath.   SIMBRINZA 1-0.2 % Susp Generic drug:  Brinzolamide-Brimonidine Place 1 drop into both eyes 3 (three) times daily.   sucralfate 1 g tablet Commonly known as:  CARAFATE Take 1 tablet (1 g total) by mouth 4 (four) times daily -  with meals and at bedtime.   tiotropium 18 MCG inhalation capsule Commonly known as:  SPIRIVA Place 1 capsule (18 mcg total) into inhaler and inhale daily. What changed:    when to take this  reasons to take this   tiZANidine 4 MG tablet Commonly known as:  ZANAFLEX TAKE 1 TABLET(4 MG) BY MOUTH EVERY 8 HOURS AS NEEDED FOR MUSCLE SPASMS

## 2018-08-06 NOTE — Progress Notes (Signed)
Urology Progress Note   1 Day Post-Op  Subjective: NAEON AFVSS UOP adequate, clear yellow Tolerating CLD Not yet ambulating AM labs not yet collected  Objective: Vital signs in last 24 hours: Temp:  [97.5 F (36.4 C)-98.1 F (36.7 C)] 97.7 F (36.5 C) (08/08 0425) Pulse Rate:  [64-82] 71 (08/08 0425) Resp:  [12-19] 18 (08/08 0425) BP: (125-167)/(80-102) 140/82 (08/08 0425) SpO2:  [96 %-100 %] 96 % (08/08 0425) Weight:  [73.9 kg] 73.9 kg (08/07 1151)  Intake/Output from previous day: 08/07 0701 - 08/08 0700 In: 3102.1 [I.V.:2902.1; IV Piggyback:200] Out: 770 [Urine:500; Blood:270] Intake/Output this shift: No intake/output data recorded.  Physical Exam:  General: Alert and oriented CV: RRR Lungs: Clear Abdomen: Soft, appropriately tender. Incisions c/d/i GU: Foley in place draining clear yellow urine Ext: NT, No erythema  Lab Results: Recent Labs    08/05/18 1554  HGB 14.0  HCT 42.3   BMET No results for input(s): NA, K, CL, CO2, GLUCOSE, BUN, CREATININE, CALCIUM in the last 72 hours.   Studies/Results: No results found.  Assessment/Plan:  67 y.o. female s/p robotic left partial nephrectomy.  Overall doing well post-op.   - AM labs still pending - [place], regular diet - d/c Foley - Ambulate - Likely discharge home later today   Dispo: floor   LOS: 1 day   Case Rob Bunting 08/06/2018, 7:32 AM

## 2018-08-06 NOTE — Care Management CC44 (Signed)
Condition Code 44 Documentation Completed  Patient Details  Name: Leslie Allen MRN: [id number] Date of Birth: Sep 01, 1951   Condition Code 44 given:  Yes Patient signature on Condition Code 44 notice:  Yes Documentation of 2 MD's agreement:  Yes Code 44 added to claim:  Yes    Chaley, Castellanos, RN 08/06/2018, 12:29 PM

## 2018-08-06 NOTE — Care Management Obs Status (Signed)
[facility] NOTIFICATION   Patient Details  Name: Leslie Allen MRN: [id number] Date of Birth: November 30, 1951   Medicare Observation Status Notification Given:  Yes    [place], Head, RN 08/06/2018, 12:29 PM

## 2018-08-06 NOTE — Progress Notes (Signed)
Per UR note correction-cc44 given-observation status per attending.

## 2018-08-06 NOTE — Progress Notes (Signed)
Per UR nurse Sarah Jeffries-Inpt procedure. No cc44 needed.

## 2018-09-20 ENCOUNTER — Encounter (HOSPITAL_COMMUNITY): Payer: Self-pay | Admitting: Emergency Medicine

## 2018-09-20 ENCOUNTER — Emergency Department (HOSPITAL_COMMUNITY): Payer: Medicare Other

## 2018-09-20 ENCOUNTER — Emergency Department (HOSPITAL_COMMUNITY)
Admission: EM | Admit: 2018-09-20 | Discharge: 2018-09-21 | Disposition: A | Discharge status: Home / Self Care | Payer: Medicare Other | Attending: Emergency Medicine | Admitting: Emergency Medicine

## 2018-09-20 DIAGNOSIS — J4541 Moderate persistent asthma with (acute) exacerbation: Secondary | ICD-10-CM | POA: Insufficient documentation

## 2018-09-20 DIAGNOSIS — I10 Essential (primary) hypertension: Secondary | ICD-10-CM | POA: Insufficient documentation

## 2018-09-20 DIAGNOSIS — R079 Chest pain, unspecified: Secondary | ICD-10-CM | POA: Diagnosis present

## 2018-09-20 DIAGNOSIS — R0789 Other chest pain: Secondary | ICD-10-CM | POA: Insufficient documentation

## 2018-09-20 DIAGNOSIS — Z87891 Personal history of nicotine dependence: Secondary | ICD-10-CM | POA: Diagnosis not present

## 2018-09-20 DIAGNOSIS — Z79899 Other long term (current) drug therapy: Secondary | ICD-10-CM | POA: Insufficient documentation

## 2018-09-20 LAB — CBC
HEMATOCRIT: 44.4 % (ref 36.0–46.0)
Hemoglobin: 14 g/dL (ref 12.0–15.0)
MCH: 26.8 pg (ref 26.0–34.0)
MCHC: 31.5 g/dL (ref 30.0–36.0)
MCV: 85.1 fL (ref 78.0–100.0)
PLATELETS: 262 10*3/uL (ref 150–400)
RBC: 5.22 MIL/uL — ABNORMAL HIGH (ref 3.87–5.11)
RDW: 15.9 % — AB (ref 11.5–15.5)
WBC: 10.8 10*3/uL — AB (ref 4.0–10.5)

## 2018-09-20 LAB — BASIC METABOLIC PANEL
Anion gap: 14 (ref 5–15)
BUN: 13 mg/dL (ref 8–23)
CALCIUM: 9.4 mg/dL (ref 8.9–10.3)
CO2: 22 mmol/L (ref 22–32)
Chloride: 105 mmol/L (ref 98–111)
Creatinine, Ser: 0.99 mg/dL (ref 0.44–1.00)
GFR calc Af Amer: >60 mL/min (ref >60)
GFR, EST NON AFRICAN AMERICAN: 57 mL/min — AB (ref >60)
GLUCOSE: 82 mg/dL (ref 70–99)
POTASSIUM: 3.3 mmol/L — AB (ref 3.5–5.1)
SODIUM: 141 mmol/L (ref 135–145)

## 2018-09-20 LAB — I-STAT TROPONIN, ED: TROPONIN I, POC: 0 ng/mL (ref 0.00–0.08)
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
## 2018-09-21 ENCOUNTER — Emergency Department (HOSPITAL_COMMUNITY)
Admission: EM | Admit: 2018-09-21 | Discharge: 2018-09-21 | Disposition: A | Discharge status: Home / Self Care | Payer: Medicare Other | Source: Home / Self Care | Attending: Emergency Medicine | Admitting: Emergency Medicine

## 2018-09-21 ENCOUNTER — Encounter (HOSPITAL_COMMUNITY): Payer: Self-pay | Admitting: Emergency Medicine

## 2018-09-21 DIAGNOSIS — J4540 Moderate persistent asthma, uncomplicated: Secondary | ICD-10-CM

## 2018-09-21 DIAGNOSIS — N189 Chronic kidney disease, unspecified: Secondary | ICD-10-CM

## 2018-09-21 DIAGNOSIS — Z853 Personal history of malignant neoplasm of breast: Secondary | ICD-10-CM

## 2018-09-21 DIAGNOSIS — I129 Hypertensive chronic kidney disease with stage 1 through stage 4 chronic kidney disease, or unspecified chronic kidney disease: Secondary | ICD-10-CM

## 2018-09-21 DIAGNOSIS — Z87891 Personal history of nicotine dependence: Secondary | ICD-10-CM

## 2018-09-21 DIAGNOSIS — Z79899 Other long term (current) drug therapy: Secondary | ICD-10-CM

## 2018-09-21 DIAGNOSIS — J4521 Mild intermittent asthma with (acute) exacerbation: Secondary | ICD-10-CM | POA: Insufficient documentation

## 2018-09-21 DIAGNOSIS — R0789 Other chest pain: Secondary | ICD-10-CM | POA: Diagnosis not present

## 2018-09-21 DIAGNOSIS — J449 Chronic obstructive pulmonary disease, unspecified: Secondary | ICD-10-CM

## 2018-09-21 LAB — I-STAT TROPONIN, ED: Troponin i, poc: 0 ng/mL (ref 0.00–0.08)

## 2018-09-21 MED ORDER — ALBUTEROL SULFATE (2.5 MG/3ML) 0.083% IN NEBU: 2.5000 mg | INHALATION_SOLUTION | Freq: Four times a day (QID) | RESPIRATORY_TRACT | 0 refills | Status: AC | PRN

## 2018-09-21 MED ORDER — IPRATROPIUM-ALBUTEROL 0.5-2.5 (3) MG/3ML IN SOLN
3.0000 mL | Freq: Once | RESPIRATORY_TRACT | Status: AC
  Administered 2018-09-21: 3 mL via RESPIRATORY_TRACT
  Filled 2018-09-21: qty 3

## 2018-09-21 MED ORDER — PREDNISONE 20 MG PO TABS
20.0000 mg | ORAL_TABLET | Freq: Once | ORAL | Status: AC
  Administered 2018-09-21: 20 mg via ORAL
  Filled 2018-09-21: qty 1

## 2018-09-21 MED ORDER — ALBUTEROL SULFATE HFA 108 (90 BASE) MCG/ACT IN AERS
2.0000 | INHALATION_SPRAY | Freq: Once | RESPIRATORY_TRACT | Status: AC
  Administered 2018-09-21: 2 via RESPIRATORY_TRACT
  Filled 2018-09-21: qty 6.7
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
## 2018-09-21 NOTE — ED Notes (Signed)
Pt departed in NAD, in [employer] custody.
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
## 2018-09-21 NOTE — ED Notes (Signed)
Per [employer] they cant find patient.

## 2018-09-21 NOTE — ED Notes (Signed)
Bed: [facility] Expected date:  Expected time:  Means of arrival:  Comments: 

## 2018-09-21 NOTE — Discharge Instructions (Signed)
Continue the steroids prescribed by your doctor and continue using her albuterol as needed.  Schedule follow-up with your doctor this week.  Return to the ER if your breathing worsens.

## 2018-09-21 NOTE — ED Notes (Signed)
Bed: [facility] Expected date:  Expected time:  Means of arrival:  Comments: 

## 2018-09-21 NOTE — ED Notes (Addendum)
Pt ws seen earlier today for asthma. She was under police custody, but eloped. Pt was detained again and is now under custody again. Pt had a breathing treatment and reports she still feels relief. Pt is speaking in complete sentences. No distress noted

## 2018-09-21 NOTE — Discharge Instructions (Addendum)
Please follow up with your doctor.

## 2018-12-07 ENCOUNTER — Other Ambulatory Visit (HOSPITAL_COMMUNITY): Payer: Self-pay | Admitting: Urology

## 2018-12-07 ENCOUNTER — Ambulatory Visit (HOSPITAL_COMMUNITY)
Admission: RE | Admit: 2018-12-07 | Discharge: 2018-12-07 | Disposition: A | Discharge status: Home / Self Care | Payer: Medicare Other | Source: Ambulatory Visit | Attending: Urology | Admitting: Urology

## 2018-12-07 DIAGNOSIS — C642 Malignant neoplasm of left kidney, except renal pelvis: Secondary | ICD-10-CM | POA: Insufficient documentation

## 2019-01-22 ENCOUNTER — Other Ambulatory Visit: Payer: Self-pay | Admitting: Orthopedic Surgery

## 2019-01-22 DIAGNOSIS — M4802 Spinal stenosis, cervical region: Secondary | ICD-10-CM

## 2019-02-12 ENCOUNTER — Ambulatory Visit
Admission: RE | Admit: 2019-02-12 | Discharge: 2019-02-12 | Disposition: A | Discharge status: Home / Self Care | Payer: Medicare Other | Source: Ambulatory Visit | Attending: Orthopedic Surgery | Admitting: Orthopedic Surgery

## 2019-02-12 DIAGNOSIS — M4802 Spinal stenosis, cervical region: Secondary | ICD-10-CM

## 2019-03-11 ENCOUNTER — Other Ambulatory Visit: Payer: Self-pay | Admitting: Family Medicine

## 2019-03-11 DIAGNOSIS — J449 Chronic obstructive pulmonary disease, unspecified: Secondary | ICD-10-CM

## 2019-03-11 DIAGNOSIS — Z720 Tobacco use: Secondary | ICD-10-CM

## 2019-07-06 ENCOUNTER — Other Ambulatory Visit: Payer: Self-pay | Admitting: Pain Medicine

## 2019-07-06 DIAGNOSIS — M545 Low back pain, unspecified: Secondary | ICD-10-CM

## 2019-07-21 ENCOUNTER — Other Ambulatory Visit: Payer: Self-pay

## 2019-07-21 ENCOUNTER — Ambulatory Visit
Admission: RE | Admit: 2019-07-21 | Discharge: 2019-07-21 | Disposition: A | Discharge status: Home / Self Care | Payer: Medicare Other | Source: Ambulatory Visit | Attending: Pain Medicine | Admitting: Pain Medicine

## 2019-07-21 ENCOUNTER — Emergency Department (HOSPITAL_COMMUNITY)
Admission: EM | Admit: 2019-07-21 | Discharge: 2019-07-21 | Discharge status: Absent without leave | Payer: Medicare Other

## 2019-07-21 DIAGNOSIS — M545 Low back pain, unspecified: Secondary | ICD-10-CM

## 2019-07-22 ENCOUNTER — Other Ambulatory Visit: Payer: Self-pay

## 2019-07-22 ENCOUNTER — Ambulatory Visit (HOSPITAL_COMMUNITY)
Admission: EM | Admit: 2019-07-22 | Discharge: 2019-07-22 | Disposition: A | Discharge status: Home / Self Care | Payer: Medicare Other | Attending: Family Medicine | Admitting: Family Medicine

## 2019-07-22 ENCOUNTER — Encounter (HOSPITAL_COMMUNITY): Payer: Self-pay

## 2019-07-22 DIAGNOSIS — M5442 Lumbago with sciatica, left side: Secondary | ICD-10-CM

## 2019-07-22 DIAGNOSIS — M5441 Lumbago with sciatica, right side: Secondary | ICD-10-CM | POA: Diagnosis not present

## 2019-07-22 DIAGNOSIS — G8929 Other chronic pain: Secondary | ICD-10-CM | POA: Diagnosis not present

## 2019-07-22 MED ORDER — PREDNISONE 10 MG (21) PO TBPK: ORAL_TABLET | ORAL | 0 refills | Status: AC

## 2019-07-22 MED ORDER — KETOROLAC TROMETHAMINE 30 MG/ML IJ SOLN
INTRAMUSCULAR | Status: AC
  Filled 2019-07-22: qty 1

## 2019-07-22 MED ORDER — KETOROLAC TROMETHAMINE 30 MG/ML IJ SOLN
30.0000 mg | Freq: Once | INTRAMUSCULAR | Status: AC
  Administered 2019-07-22: 30 mg via INTRAMUSCULAR

## 2019-07-22 NOTE — Discharge Instructions (Addendum)
Take the prednisone as prescribed.  Take this with food. You can continue taking your oxycodone as needed Please follow-up with your doctor today for CT results and further evaluation and management If you start developing more severe symptoms to include loss of bowel or bladder function or you are unable to ambulate you need to go the hospital
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
## 2019-07-22 NOTE — ED Triage Notes (Signed)
Pt presents with chronic back pain across lower back that radiates from hip to leg down to her toes, mostly on her left side.  Pt states she had a CAT scan done of the area yesterday but the results will be forwarded to her PCP.

## 2019-07-23 ENCOUNTER — Emergency Department (HOSPITAL_COMMUNITY)
Admission: EM | Admit: 2019-07-23 | Discharge: 2019-07-23 | Disposition: A | Discharge status: Home / Self Care | Payer: Medicare Other | Attending: Emergency Medicine | Admitting: Emergency Medicine

## 2019-07-23 ENCOUNTER — Encounter (HOSPITAL_COMMUNITY): Payer: Self-pay

## 2019-07-23 ENCOUNTER — Other Ambulatory Visit: Payer: Self-pay

## 2019-07-23 DIAGNOSIS — E039 Hypothyroidism, unspecified: Secondary | ICD-10-CM | POA: Insufficient documentation

## 2019-07-23 DIAGNOSIS — R51 Headache: Secondary | ICD-10-CM | POA: Diagnosis not present

## 2019-07-23 DIAGNOSIS — M25552 Pain in left hip: Secondary | ICD-10-CM | POA: Insufficient documentation

## 2019-07-23 DIAGNOSIS — Z87891 Personal history of nicotine dependence: Secondary | ICD-10-CM | POA: Insufficient documentation

## 2019-07-23 DIAGNOSIS — I129 Hypertensive chronic kidney disease with stage 1 through stage 4 chronic kidney disease, or unspecified chronic kidney disease: Secondary | ICD-10-CM | POA: Insufficient documentation

## 2019-07-23 DIAGNOSIS — J45909 Unspecified asthma, uncomplicated: Secondary | ICD-10-CM | POA: Diagnosis not present

## 2019-07-23 DIAGNOSIS — M4726 Other spondylosis with radiculopathy, lumbar region: Secondary | ICD-10-CM

## 2019-07-23 DIAGNOSIS — J449 Chronic obstructive pulmonary disease, unspecified: Secondary | ICD-10-CM | POA: Insufficient documentation

## 2019-07-23 DIAGNOSIS — Z79899 Other long term (current) drug therapy: Secondary | ICD-10-CM | POA: Insufficient documentation

## 2019-07-23 DIAGNOSIS — N189 Chronic kidney disease, unspecified: Secondary | ICD-10-CM | POA: Insufficient documentation

## 2019-07-23 MED ORDER — ONDANSETRON 8 MG PO TBDP
8.0000 mg | ORAL_TABLET | Freq: Once | ORAL | Status: AC
  Administered 2019-07-23: 8 mg via ORAL
  Filled 2019-07-23: qty 1

## 2019-07-23 MED ORDER — LORAZEPAM 2 MG/ML IJ SOLN
2.0000 mg | Freq: Once | INTRAMUSCULAR | Status: AC
  Administered 2019-07-23: 2 mg via INTRAMUSCULAR
  Filled 2019-07-23: qty 1

## 2019-07-23 MED ORDER — HYDROMORPHONE HCL 2 MG/ML IJ SOLN
2.0000 mg | Freq: Once | INTRAMUSCULAR | Status: AC
  Administered 2019-07-23: 10:00:00 2 mg via INTRAMUSCULAR
  Filled 2019-07-23: qty 1
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
## 2019-07-23 NOTE — ED Notes (Signed)
Patient given discharge teaching and verbalized understanding. Patient ambulated out of ED with a steady gait. 

## 2019-07-23 NOTE — ED Notes (Signed)
Patient ambulated to the restroom w/ a steady gate.

## 2019-07-23 NOTE — Discharge Instructions (Addendum)
Take the prednisone, prescription given to you yesterday.  This should help the abnormality seen on the CT scan which showed multilevel degenerative changes, with spinal stenosis and nerve impingement.  This can help with inflammation and should improve your pain over the next few days to weeks.  We are referring you to a neurosurgeon, Dr. Trenton Gammon who can help you if you choose to have a surgical treatment which he can offer.  Follow-up with your pain management doctor as usual.

## 2019-07-23 NOTE — ED Triage Notes (Signed)
Patient c/o left hip that radiates into the left leg x 8 days. Patient was seen yesterday for the same.  Patient also c/o hypertension and headache since this AM.

## 2019-07-31 ENCOUNTER — Other Ambulatory Visit: Payer: Self-pay

## 2019-07-31 ENCOUNTER — Emergency Department (HOSPITAL_COMMUNITY): Payer: Medicare Other

## 2019-07-31 ENCOUNTER — Emergency Department (HOSPITAL_COMMUNITY)
Admission: EM | Admit: 2019-07-31 | Discharge: 2019-07-31 | Disposition: A | Discharge status: Home / Self Care | Payer: Medicare Other | Attending: Emergency Medicine | Admitting: Emergency Medicine

## 2019-07-31 ENCOUNTER — Encounter (HOSPITAL_COMMUNITY): Payer: Self-pay | Admitting: *Deleted

## 2019-07-31 DIAGNOSIS — S161XXA Strain of muscle, fascia and tendon at neck level, initial encounter: Secondary | ICD-10-CM | POA: Diagnosis not present

## 2019-07-31 DIAGNOSIS — S060X9A Concussion with loss of consciousness of unspecified duration, initial encounter: Secondary | ICD-10-CM | POA: Diagnosis not present

## 2019-07-31 DIAGNOSIS — Y9389 Activity, other specified: Secondary | ICD-10-CM | POA: Insufficient documentation

## 2019-07-31 DIAGNOSIS — Z853 Personal history of malignant neoplasm of breast: Secondary | ICD-10-CM | POA: Diagnosis not present

## 2019-07-31 DIAGNOSIS — Z79899 Other long term (current) drug therapy: Secondary | ICD-10-CM | POA: Insufficient documentation

## 2019-07-31 DIAGNOSIS — Z87891 Personal history of nicotine dependence: Secondary | ICD-10-CM | POA: Diagnosis not present

## 2019-07-31 DIAGNOSIS — Y998 Other external cause status: Secondary | ICD-10-CM | POA: Diagnosis not present

## 2019-07-31 DIAGNOSIS — N189 Chronic kidney disease, unspecified: Secondary | ICD-10-CM | POA: Diagnosis not present

## 2019-07-31 DIAGNOSIS — I129 Hypertensive chronic kidney disease with stage 1 through stage 4 chronic kidney disease, or unspecified chronic kidney disease: Secondary | ICD-10-CM | POA: Insufficient documentation

## 2019-07-31 DIAGNOSIS — Z8673 Personal history of transient ischemic attack (TIA), and cerebral infarction without residual deficits: Secondary | ICD-10-CM | POA: Insufficient documentation

## 2019-07-31 DIAGNOSIS — R42 Dizziness and giddiness: Secondary | ICD-10-CM | POA: Diagnosis not present

## 2019-07-31 DIAGNOSIS — Y929 Unspecified place or not applicable: Secondary | ICD-10-CM | POA: Insufficient documentation

## 2019-07-31 DIAGNOSIS — J449 Chronic obstructive pulmonary disease, unspecified: Secondary | ICD-10-CM | POA: Diagnosis not present

## 2019-07-31 DIAGNOSIS — S0990XA Unspecified injury of head, initial encounter: Secondary | ICD-10-CM | POA: Diagnosis present

## 2019-07-31 NOTE — Discharge Instructions (Signed)
Continue your current medications, please review the discharge instructions.  Your symptoms should improve over the next week.  Return as needed for worsening symptoms.
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
## 2019-08-09 ENCOUNTER — Inpatient Hospital Stay (HOSPITAL_COMMUNITY): Payer: Medicare Other

## 2019-08-09 ENCOUNTER — Emergency Department (HOSPITAL_COMMUNITY): Payer: Medicare Other

## 2019-08-09 ENCOUNTER — Inpatient Hospital Stay (HOSPITAL_COMMUNITY)
Admission: EM | Admit: 2019-08-09 | Discharge: 2019-08-31 | DRG: 080 | Disposition: E | Payer: Medicare Other | Attending: Pulmonary Disease | Admitting: Pulmonary Disease

## 2019-08-09 DIAGNOSIS — R402212 Coma scale, best verbal response, none, at arrival to emergency department: Secondary | ICD-10-CM | POA: Diagnosis present

## 2019-08-09 DIAGNOSIS — R402312 Coma scale, best motor response, none, at arrival to emergency department: Secondary | ICD-10-CM | POA: Diagnosis present

## 2019-08-09 DIAGNOSIS — N182 Chronic kidney disease, stage 2 (mild): Secondary | ICD-10-CM | POA: Diagnosis present

## 2019-08-09 DIAGNOSIS — R9431 Abnormal electrocardiogram [ECG] [EKG]: Secondary | ICD-10-CM | POA: Diagnosis not present

## 2019-08-09 DIAGNOSIS — Z9011 Acquired absence of right breast and nipple: Secondary | ICD-10-CM

## 2019-08-09 DIAGNOSIS — G936 Cerebral edema: Secondary | ICD-10-CM | POA: Diagnosis present

## 2019-08-09 DIAGNOSIS — I4901 Ventricular fibrillation: Secondary | ICD-10-CM | POA: Diagnosis present

## 2019-08-09 DIAGNOSIS — Y848 Other medical procedures as the cause of abnormal reaction of the patient, or of later complication, without mention of misadventure at the time of the procedure: Secondary | ICD-10-CM | POA: Diagnosis present

## 2019-08-09 DIAGNOSIS — Z8673 Personal history of transient ischemic attack (TIA), and cerebral infarction without residual deficits: Secondary | ICD-10-CM | POA: Diagnosis not present

## 2019-08-09 DIAGNOSIS — S2241XA Multiple fractures of ribs, right side, initial encounter for closed fracture: Secondary | ICD-10-CM | POA: Diagnosis present

## 2019-08-09 DIAGNOSIS — K7200 Acute and subacute hepatic failure without coma: Secondary | ICD-10-CM | POA: Diagnosis present

## 2019-08-09 DIAGNOSIS — Z66 Do not resuscitate: Secondary | ICD-10-CM | POA: Diagnosis not present

## 2019-08-09 DIAGNOSIS — I129 Hypertensive chronic kidney disease with stage 1 through stage 4 chronic kidney disease, or unspecified chronic kidney disease: Secondary | ICD-10-CM | POA: Diagnosis present

## 2019-08-09 DIAGNOSIS — Z853 Personal history of malignant neoplasm of breast: Secondary | ICD-10-CM

## 2019-08-09 DIAGNOSIS — J9601 Acute respiratory failure with hypoxia: Secondary | ICD-10-CM | POA: Diagnosis present

## 2019-08-09 DIAGNOSIS — N179 Acute kidney failure, unspecified: Secondary | ICD-10-CM | POA: Diagnosis present

## 2019-08-09 DIAGNOSIS — I452 Bifascicular block: Secondary | ICD-10-CM | POA: Diagnosis present

## 2019-08-09 DIAGNOSIS — E872 Acidosis, unspecified: Secondary | ICD-10-CM

## 2019-08-09 DIAGNOSIS — Z87891 Personal history of nicotine dependence: Secondary | ICD-10-CM

## 2019-08-09 DIAGNOSIS — Z20828 Contact with and (suspected) exposure to other viral communicable diseases: Secondary | ICD-10-CM | POA: Diagnosis present

## 2019-08-09 DIAGNOSIS — Z515 Encounter for palliative care: Secondary | ICD-10-CM | POA: Diagnosis not present

## 2019-08-09 DIAGNOSIS — J939 Pneumothorax, unspecified: Secondary | ICD-10-CM

## 2019-08-09 DIAGNOSIS — G931 Anoxic brain damage, not elsewhere classified: Secondary | ICD-10-CM | POA: Diagnosis present

## 2019-08-09 DIAGNOSIS — S270XXA Traumatic pneumothorax, initial encounter: Secondary | ICD-10-CM | POA: Diagnosis present

## 2019-08-09 DIAGNOSIS — Z905 Acquired absence of kidney: Secondary | ICD-10-CM | POA: Diagnosis not present

## 2019-08-09 DIAGNOSIS — Z7951 Long term (current) use of inhaled steroids: Secondary | ICD-10-CM

## 2019-08-09 DIAGNOSIS — F329 Major depressive disorder, single episode, unspecified: Secondary | ICD-10-CM | POA: Diagnosis present

## 2019-08-09 DIAGNOSIS — I469 Cardiac arrest, cause unspecified: Secondary | ICD-10-CM

## 2019-08-09 DIAGNOSIS — I959 Hypotension, unspecified: Secondary | ICD-10-CM | POA: Diagnosis present

## 2019-08-09 DIAGNOSIS — R402112 Coma scale, eyes open, never, at arrival to emergency department: Secondary | ICD-10-CM | POA: Diagnosis present

## 2019-08-09 LAB — POCT I-STAT 7, (LYTES, BLD GAS, ICA,H+H)
Acid-base deficit: 16 mmol/L — ABNORMAL HIGH (ref 0.0–2.0)
Acid-base deficit: 14 mmol/L — ABNORMAL HIGH (ref 0.0–2.0)
Bicarbonate: 12.7 mmol/L — ABNORMAL LOW (ref 20.0–28.0)
Bicarbonate: 12.8 mmol/L — ABNORMAL LOW (ref 20.0–28.0)
Calcium, Ion: 1.16 mmol/L (ref 1.15–1.40)
Calcium, Ion: 1.15 mmol/L (ref 1.15–1.40)
HCT: 43 % (ref 36.0–46.0)
HCT: 43 % (ref 36.0–46.0)
Hemoglobin: 14.6 g/dL (ref 12.0–15.0)
Hemoglobin: 14.6 g/dL (ref 12.0–15.0)
O2 Saturation: 80 %
O2 Saturation: 96 %
Patient temperature: 93.1
Potassium: 5 mmol/L (ref 3.5–5.1)
Potassium: 5.1 mmol/L (ref 3.5–5.1)
Sodium: 140 mmol/L (ref 135–145)
Sodium: 138 mmol/L (ref 135–145)
TCO2: 14 mmol/L — ABNORMAL LOW (ref 22–32)
TCO2: 14 mmol/L — ABNORMAL LOW (ref 22–32)
pCO2 arterial: 40.5 mmHg (ref 32.0–48.0)
pCO2 arterial: 29.2 mmHg — ABNORMAL LOW (ref 32.0–48.0)
pH, Arterial: 7.105 — CL (ref 7.350–7.450)
pH, Arterial: 7.231 — ABNORMAL LOW (ref 7.350–7.450)
pO2, Arterial: 60 mmHg — ABNORMAL LOW (ref 83.0–108.0)
pO2, Arterial: 87 mmHg (ref 83.0–108.0)

## 2019-08-09 LAB — URINALYSIS, ROUTINE W REFLEX MICROSCOPIC
Bilirubin Urine: NEGATIVE
Glucose, UA: 150 mg/dL — AB
Ketones, ur: NEGATIVE mg/dL
Leukocytes,Ua: NEGATIVE
Nitrite: NEGATIVE
Protein, ur: 100 mg/dL — AB
Specific Gravity, Urine: 1.01 (ref 1.005–1.030)
pH: 6 (ref 5.0–8.0)

## 2019-08-09 LAB — SARS CORONAVIRUS 2 BY RT PCR (HOSPITAL ORDER, PERFORMED IN ~~LOC~~ HOSPITAL LAB): SARS Coronavirus 2: NEGATIVE

## 2019-08-09 LAB — CBC WITH DIFFERENTIAL/PLATELET
Abs Immature Granulocytes: 1.3 10*3/uL — ABNORMAL HIGH (ref 0.00–0.07)
Basophils Absolute: 0.1 10*3/uL (ref 0.0–0.1)
Basophils Relative: 1 %
Eosinophils Absolute: 0.4 10*3/uL (ref 0.0–0.5)
Eosinophils Relative: 3 %
HCT: 44 % (ref 36.0–46.0)
Hemoglobin: 13.1 g/dL (ref 12.0–15.0)
Lymphocytes Relative: 24 %
Lymphs Abs: 3.4 10*3/uL (ref 0.7–4.0)
MCH: 27.6 pg (ref 26.0–34.0)
MCHC: 29.8 g/dL — ABNORMAL LOW (ref 30.0–36.0)
MCV: 92.8 fL (ref 80.0–100.0)
Metamyelocytes Relative: 2 %
Monocytes Absolute: 1.1 10*3/uL — ABNORMAL HIGH (ref 0.1–1.0)
Monocytes Relative: 8 %
Myelocytes: 7 %
Neutro Abs: 7.8 10*3/uL — ABNORMAL HIGH (ref 1.7–7.7)
Neutrophils Relative %: 55 %
Platelets: 150 10*3/uL (ref 150–400)
RBC: 4.74 MIL/uL (ref 3.87–5.11)
RDW: 15.6 % — ABNORMAL HIGH (ref 11.5–15.5)
WBC: 14.1 10*3/uL — ABNORMAL HIGH (ref 4.0–10.5)
nRBC: 0.3 % — ABNORMAL HIGH (ref 0.0–0.2)
nRBC: 2 /100 WBC — ABNORMAL HIGH

## 2019-08-09 LAB — COMPREHENSIVE METABOLIC PANEL
ALT: 574 U/L — ABNORMAL HIGH (ref 0–44)
AST: 581 U/L — ABNORMAL HIGH (ref 15–41)
Albumin: 3 g/dL — ABNORMAL LOW (ref 3.5–5.0)
Alkaline Phosphatase: 86 U/L (ref 38–126)
Anion gap: 20 — ABNORMAL HIGH (ref 5–15)
BUN: 13 mg/dL (ref 8–23)
CO2: 16 mmol/L — ABNORMAL LOW (ref 22–32)
Calcium: 8.6 mg/dL — ABNORMAL LOW (ref 8.9–10.3)
Chloride: 106 mmol/L (ref 98–111)
Creatinine, Ser: 1.47 mg/dL — ABNORMAL HIGH (ref 0.44–1.00)
GFR calc Af Amer: 41 mL/min — ABNORMAL LOW (ref >60)
GFR calc non Af Amer: 36 mL/min — ABNORMAL LOW (ref >60)
Glucose, Bld: 339 mg/dL — ABNORMAL HIGH (ref 70–99)
Potassium: 4.7 mmol/L (ref 3.5–5.1)
Sodium: 142 mmol/L (ref 135–145)
Total Bilirubin: 2 mg/dL — ABNORMAL HIGH (ref 0.3–1.2)
Total Protein: 5.4 g/dL — ABNORMAL LOW (ref 6.5–8.1)

## 2019-08-09 LAB — RAPID URINE DRUG SCREEN, HOSP PERFORMED
Amphetamines: NOT DETECTED
Barbiturates: NOT DETECTED
Benzodiazepines: NOT DETECTED
Cocaine: NOT DETECTED
Opiates: NOT DETECTED
Tetrahydrocannabinol: NOT DETECTED

## 2019-08-09 LAB — I-STAT CHEM 8, ED
BUN: 14 mg/dL (ref 8–23)
Calcium, Ion: 1.16 mmol/L (ref 1.15–1.40)
Chloride: 110 mmol/L (ref 98–111)
Creatinine, Ser: 1.2 mg/dL — ABNORMAL HIGH (ref 0.44–1.00)
Glucose, Bld: 338 mg/dL — ABNORMAL HIGH (ref 70–99)
HCT: 43 % (ref 36.0–46.0)
Hemoglobin: 14.6 g/dL (ref 12.0–15.0)
Potassium: 4 mmol/L (ref 3.5–5.1)
Sodium: 140 mmol/L (ref 135–145)
TCO2: 18 mmol/L — ABNORMAL LOW (ref 22–32)

## 2019-08-09 LAB — LACTIC ACID, PLASMA
Lactic Acid, Venous: >11 mmol/L (ref 0.5–1.9)
Lactic Acid, Venous: 7.4 mmol/L (ref 0.5–1.9)
Lactic Acid, Venous: 4.9 mmol/L (ref 0.5–1.9)

## 2019-08-09 LAB — ECHOCARDIOGRAM COMPLETE
Height: 68 in
Weight: 2624 oz

## 2019-08-09 LAB — TROPONIN I (HIGH SENSITIVITY)
Troponin I (High Sensitivity): 131 ng/L (ref <18)
Troponin I (High Sensitivity): 526 ng/L (ref <18)

## 2019-08-09 LAB — MAGNESIUM: Magnesium: 1.9 mg/dL (ref 1.7–2.4)

## 2019-08-09 LAB — ETHANOL: Alcohol, Ethyl (B): <10 mg/dL (ref <10)

## 2019-08-09 LAB — PROCALCITONIN: Procalcitonin: 3.38 ng/mL

## 2019-08-09 MED ORDER — NOREPINEPHRINE 4 MG/250ML-% IV SOLN
0.0000 ug/min | INTRAVENOUS | Status: DC
  Administered 2019-08-09: 40 ug/min via INTRAVENOUS
  Filled 2019-08-09 (×3): qty 250

## 2019-08-09 MED ORDER — ALBUTEROL SULFATE (2.5 MG/3ML) 0.083% IN NEBU: 2.5000 mg | INHALATION_SOLUTION | RESPIRATORY_TRACT | Status: DC | PRN

## 2019-08-09 MED ORDER — PANTOPRAZOLE SODIUM 40 MG IV SOLR: 40.0000 mg | Freq: Every day | INTRAVENOUS | Status: DC

## 2019-08-09 MED ORDER — CHLORHEXIDINE GLUCONATE CLOTH 2 % EX PADS
6.0000 | MEDICATED_PAD | Freq: Every day | CUTANEOUS | Status: DC
  Administered 2019-08-09: 16:00:00 6 via TOPICAL

## 2019-08-09 MED ORDER — HEPARIN SODIUM (PORCINE) 5000 UNIT/ML IJ SOLN: 5000.0000 [IU] | Freq: Three times a day (TID) | INTRAMUSCULAR | Status: DC

## 2019-08-09 MED ORDER — ATROPINE SULFATE 1 MG/ML IJ SOLN
INTRAMUSCULAR | Status: AC | PRN
  Administered 2019-08-09: 0.4 mg via INTRAVENOUS
  Administered 2019-08-09: .5 mg via INTRAVENOUS

## 2019-08-09 MED ORDER — IOHEXOL 350 MG/ML SOLN
75.0000 mL | Freq: Once | INTRAVENOUS | Status: AC | PRN
  Administered 2019-08-09: 14:00:00 75 mL via INTRAVENOUS

## 2019-08-09 MED ORDER — LACTATED RINGERS IV BOLUS
1000.0000 mL | Freq: Once | INTRAVENOUS | Status: AC
  Administered 2019-08-09: 1000 mL via INTRAVENOUS

## 2019-08-09 MED ORDER — EPINEPHRINE 1 MG/10ML IJ SOSY
PREFILLED_SYRINGE | INTRAMUSCULAR | Status: AC | PRN
  Administered 2019-08-09: 1 via INTRAVENOUS

## 2019-08-09 MED ORDER — ACETAMINOPHEN 325 MG PO TABS: 650.0000 mg | ORAL_TABLET | ORAL | Status: DC | PRN

## 2019-08-09 MED ORDER — LACTATED RINGERS IV SOLN: INTRAVENOUS | Status: DC

## 2019-08-09 MED FILL — Medication: Qty: 1 | Status: AC

## 2019-08-10 LAB — URINE CULTURE: Culture: NO GROWTH

## 2019-08-10 LAB — GLUCOSE, CAPILLARY: Glucose-Capillary: 144 mg/dL — ABNORMAL HIGH (ref 70–99)

## 2019-08-10 LAB — HIV ANTIBODY (ROUTINE TESTING W REFLEX): HIV Screen 4th Generation wRfx: NONREACTIVE
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
## 2019-08-14 LAB — CULTURE, BLOOD (ROUTINE X 2)
Culture: NO GROWTH
Culture: NO GROWTH

## 2019-08-31 NOTE — Progress Notes (Signed)
ABG attempted 2x Unsuccessfully. Called another RT to attempt.
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
## 2019-08-31 NOTE — Progress Notes (Signed)
Pt transported by RT & RN from [facility] Rm to CT2 and back without any complications.

## 2019-08-31 NOTE — Progress Notes (Signed)
  Echocardiogram 2D Echocardiogram has been performed.  Leslie Allen 09/08/2019, 5:02 PM
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
## 2019-08-31 NOTE — ED Notes (Signed)
Patient has pulse and in NSR. Respiratory assisting ventilations.

## 2019-08-31 NOTE — Progress Notes (Signed)
Pt maxed on Levophed @ 4mcg/min. SBP in 70s (MAP 60). HR in 130s. Notified CCM, Wright. Not escalating care at the moment.   Fuller Canada, RN
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
## 2019-08-31 NOTE — Progress Notes (Signed)
Pt went Asystole at 2027, with son and granddaughters present along with RN Lolly Mustache (charge nurse- also confirmed death) at bedside, CDS called, family still at bedside, will prepare body when family leaves.

## 2019-08-31 DEATH — deceased
